# Patient Record
Sex: Female | Born: 2000 | Race: Black or African American | Hispanic: No | Marital: Single | Smoking: Current some day smoker
Health system: Southern US, Community
[De-identification: ages and names within clinical notes are randomized; demographics above are authoritative.]

## PROBLEM LIST (undated history)

## (undated) DIAGNOSIS — T7840XA Allergy, unspecified, initial encounter: Secondary | ICD-10-CM

## (undated) DIAGNOSIS — D573 Sickle-cell trait: Secondary | ICD-10-CM

## (undated) DIAGNOSIS — E669 Obesity, unspecified: Secondary | ICD-10-CM

## (undated) DIAGNOSIS — J189 Pneumonia, unspecified organism: Secondary | ICD-10-CM

## (undated) DIAGNOSIS — J9601 Acute respiratory failure with hypoxia: Secondary | ICD-10-CM

## (undated) DIAGNOSIS — D4701 Cutaneous mastocytosis: Secondary | ICD-10-CM

## (undated) DIAGNOSIS — J45909 Unspecified asthma, uncomplicated: Secondary | ICD-10-CM

## (undated) HISTORY — PX: NO PAST SURGERIES: SHX2092

## (undated) HISTORY — DX: Sickle-cell trait: D57.3

## (undated) HISTORY — DX: Cutaneous mastocytosis: D47.01

## (undated) HISTORY — DX: Acute respiratory failure with hypoxia: J96.01

---
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
## 2020-01-20 ENCOUNTER — Encounter (HOSPITAL_COMMUNITY): Payer: Self-pay

## 2020-01-20 ENCOUNTER — Other Ambulatory Visit: Payer: Self-pay

## 2020-01-20 ENCOUNTER — Emergency Department (HOSPITAL_COMMUNITY)
Admission: EM | Admit: 2020-01-20 | Discharge: 2020-01-20 | Disposition: A | Discharge status: Home / Self Care | Payer: PPO | Attending: Emergency Medicine | Admitting: Emergency Medicine

## 2020-01-20 DIAGNOSIS — R0602 Shortness of breath: Secondary | ICD-10-CM | POA: Diagnosis present

## 2020-01-20 DIAGNOSIS — R0981 Nasal congestion: Secondary | ICD-10-CM | POA: Insufficient documentation

## 2020-01-20 DIAGNOSIS — R05 Cough: Secondary | ICD-10-CM | POA: Diagnosis not present

## 2020-01-20 DIAGNOSIS — E669 Obesity, unspecified: Secondary | ICD-10-CM | POA: Diagnosis not present

## 2020-01-20 DIAGNOSIS — Z720 Tobacco use: Secondary | ICD-10-CM | POA: Diagnosis not present

## 2020-01-20 DIAGNOSIS — J4541 Moderate persistent asthma with (acute) exacerbation: Secondary | ICD-10-CM | POA: Insufficient documentation

## 2020-01-20 DIAGNOSIS — Z6835 Body mass index (BMI) 35.0-35.9, adult: Secondary | ICD-10-CM | POA: Insufficient documentation

## 2020-01-20 MED ORDER — IPRATROPIUM-ALBUTEROL 0.5-2.5 (3) MG/3ML IN SOLN
3.0000 mL | Freq: Once | RESPIRATORY_TRACT | Status: AC
  Administered 2020-01-20: 3 mL via RESPIRATORY_TRACT
  Filled 2020-01-20: qty 3

## 2020-01-20 MED ORDER — DEXAMETHASONE 6 MG PO TABS
16.0000 mg | ORAL_TABLET | Freq: Once | ORAL | Status: AC
  Administered 2020-01-20: 16 mg via ORAL
  Filled 2020-01-20: qty 1

## 2020-01-20 MED ORDER — IPRATROPIUM-ALBUTEROL 0.5-2.5 (3) MG/3ML IN SOLN
3.0000 mL | Freq: Once | RESPIRATORY_TRACT | Status: AC
  Administered 2020-01-20: 18:00:00 3 mL via RESPIRATORY_TRACT
  Filled 2020-01-20: qty 3

## 2020-01-20 MED ORDER — ALBUTEROL SULFATE HFA 108 (90 BASE) MCG/ACT IN AERS: 1.0000 | INHALATION_SPRAY | Freq: Four times a day (QID) | RESPIRATORY_TRACT | 0 refills | Status: DC | PRN

## 2020-01-20 NOTE — ED Provider Notes (Signed)
[facility] DEPART[facility] Provider Note   CSN: [id number] Arrival date & time: 01/20/20  1633     History Chief Complaint  Patient presents with  . Shortness of Breath    Phyllis Christian is a 19 y.o. female.  Patient is a 19 year old female with a history of obesity, OSA, and moderate persistent asthma who presents with shortness of breath.  Patient states she woke up today with a cold that she believes she got from her brother and this has triggered her asthma.  Patient has tried her albuterol inhaler twice with no relief.  She also reports nasal congestion and cough, no fever.  Review of systems otherwise negative.  Patient states that she does have a prescription for Flovent but she only takes it about once a week if at all because she feels like it does not help her asthma.  Patient also endorses smoking black and mild cigars daily. No other medications, no allergies. No sick contacts.   The history is provided by the patient.       Past Medical History:  Diagnosis Date  . Acute respiratory failure with hypoxia ([place])   . Allergy    Seasonal  . Asthma   . Obesity   . Pneumonia   . Sickle cell trait ([place])   . 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 Acanthosis nigricans 11/12/2016  . Vitamin D deficiency 12/27/2015  . Contact dermatitis 12/27/2015  . Painful menstrual periods 08/04/2015  . Obstructive sleep apnea 08/04/2015  . Mild intermittent asthma without complication [id number]  . Childhood obesity, BMI 95-100 percentile 02/09/2014  . Sickle cell trait ([place])   . 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 No pertinent surgical history.   OB History   No obstetric history on file.     Family History  Problem Relation Age of Onset  . Diabetes Maternal Grandfather   . Obesity Mother     Social History   Tobacco Use  . Smoking status: Passive Smoke Exposure - Never Smoker    . Smokeless tobacco: Never Used  . Tobacco comment: outside smoker  Substance Use Topics  . Alcohol use: No  . Drug use: No    Home Medications Prior to Admission medications   Medication Sig Start Date End Date Taking? Authorizing Provider  albuterol (VENTOLIN HFA) 108 (90 Base) MCG/ACT inhaler INHALE 2 PUFFS INTO THE LUNGS EVERY 4 (FOUR) HOURS AS NEEDED FOR WHEEZING OR SHORTNESS OF BREATH. Patient taking differently: Inhale 2 puffs into the lungs every 4 (four) hours as needed for wheezing or shortness of breath.  12/29/19  Yes Roselind Messier, MD  albuterol (VENTOLIN HFA) 108 (90 Base) MCG/ACT inhaler Inhale 1-2 puffs into the lungs every 6 (six) hours as needed for wheezing or shortness of breath. 01/20/20   Hagen Tidd A., DO  cetirizine (ZYRTEC ALLERGY) 10 MG tablet Take 1 tablet (10 mg total) by mouth at bedtime. Patient not taking: Reported on 01/20/2020 08/31/19   Roselind Messier, MD  FLOVENT HFA 110 MCG/ACT inhaler Inhale 2 puffs into the lungs 2 (two) times daily. Patient not taking: Reported on 01/20/2020 03/09/19   Roselind Messier, MD  fluticasone [facility]) 50 MCG/ACT nasal spray Place 1 spray into both nostrils daily. 1 spray in each nostril every day Patient not taking: Reported on 01/20/2020 08/31/19   Roselind Messier, MD  Norethindrone Acetate-Ethinyl Estradiol (LOESTRIN) 1.5-30 MG-MCG tablet Take 1 tablet by mouth  daily. Patient not taking: Reported on 01/20/2020 03/18/19   Roselind Messier, MD    Allergies    Codeine and Motrin [ibuprofen]  Review of Systems   Review of Systems  Constitutional: Negative for activity change, appetite change and fever.  HENT: Positive for congestion. Negative for sore throat and trouble swallowing.   Eyes: Negative.   Respiratory: Positive for cough, shortness of breath and wheezing.   Cardiovascular: Negative.   Gastrointestinal: Negative.   Endocrine: Negative.   Genitourinary: Negative.   Musculoskeletal: Negative.   All  other systems reviewed and are negative.   Physical Exam Updated Vital Signs BP 127/87 (BP Location: Left Arm)   Pulse 87   Temp 98.8 F (37.1 C) (Oral)   Resp 18   Ht 5\' 6"  (1.676 m)   Wt 99.8 kg   SpO2 100%   BMI 35.51 kg/m   Physical Exam Vitals and nursing note reviewed.  Constitutional:      General: She is not in acute distress.    Appearance: She is well-developed. She is obese. She is not ill-appearing or toxic-appearing.  HENT:     Head: Normocephalic and atraumatic.     Mouth/Throat:     Mouth: Mucous membranes are moist.  Eyes:     Extraocular Movements: Extraocular movements intact.  Cardiovascular:     Rate and Rhythm: Normal rate and regular rhythm.     Pulses: Normal pulses.  Pulmonary:     Effort: Respiratory distress present.     Breath sounds: Examination of the right-upper field reveals wheezing. Examination of the left-upper field reveals wheezing. Examination of the right-middle field reveals wheezing. Examination of the left-middle field reveals wheezing. Examination of the right-lower field reveals wheezing. Examination of the left-lower field reveals wheezing. Wheezing present.  Abdominal:     Palpations: Abdomen is soft.  Musculoskeletal:        General: Normal range of motion.     Cervical back: Normal range of motion and neck supple.  Skin:    General: Skin is warm and dry.     Capillary Refill: Capillary refill takes less than 2 seconds.  Neurological:     General: No focal deficit present.     Mental Status: She is alert and oriented to person, place, and time.  Psychiatric:        Mood and Affect: Mood normal.     ED Results / Procedures / Treatments   Labs (all labs ordered are listed, but only abnormal results are displayed) Labs Reviewed - No data to display  EKG None  Radiology No results found.  Procedures Procedures (including critical care time)  Medications Ordered in ED Medications  ipratropium-albuterol (DUONEB)  0.5-2.5 (3) MG/3ML nebulizer solution 3 mL (3 mLs Nebulization Given 01/20/20 1745)  ipratropium-albuterol (DUONEB) 0.5-2.5 (3) MG/3ML nebulizer solution 3 mL (3 mLs Nebulization Given 01/20/20 1744)  ipratropium-albuterol (DUONEB) 0.5-2.5 (3) MG/3ML nebulizer solution 3 mL (3 mLs Nebulization Given 01/20/20 1729)  dexamethasone (DECADRON) tablet 16 mg (16 mg Oral Given 01/20/20 1805)    ED Course  I have reviewed the triage vital signs and the nursing notes.  Pertinent labs & imaging results that were available during my care of the patient were reviewed by me and considered in my medical decision making (see chart for details).    MDM Rules/Calculators/A&P                      Patient is an 19 year old female with a  history of obesity, OSA, moderate persistent asthma with noncompliance who presents with shortness of breath in the setting of URI.  On exam she has audible wheezing, normal respiratory rate, biphasic wheezing throughout all lung fields.  She is in saturation 100% on room air.  History and exam consistent with asthma exacerbation.  Will trial duonebs x3 and decadron and reassess.  On reassessment wheezing has almost completely resolved, still some scant expiratory wheezing. Normal WOB. Patient states she feels much better. Complained of chest pain so EKG obtained and shows normal sinus rhythm, normal intervals, normal ST segments. I advised to continue albuterol every 4 hours while awake for the next 24hrs. Advised the importance of taking flovent daily to prevent exacerbations and limiting smoking. Patient stable for discharge home. Patient and family express understanding regarding plan. Return precautions discussed and all questions answered.     Final Clinical Impression(s) / ED Diagnoses Final diagnoses:  Moderate persistent asthma with exacerbation    Rx / DC Orders ED Discharge Orders         Ordered    albuterol (VENTOLIN HFA) 108 (90 Base) MCG/ACT inhaler  Every 6  hours PRN     01/20/20 1847           Aubriegh Minch A., DO 01/20/20 1852

## 2020-01-20 NOTE — ED Notes (Signed)
Pt given water to drink at this time.

## 2020-01-20 NOTE — Discharge Instructions (Addendum)
Take albuterol inhaler 6-8 puffs every 4 hours while awake for the next 24hrs Take flovent daily as prescribed!  Try to limit smoking especially during this asthma exacerbation

## 2020-01-20 NOTE — ED Triage Notes (Addendum)
Pt arrives to ED w/ c/o SOB. States she woke up w/ a cold and her asthma is worse. Resp e/u.

## 2020-02-18 ENCOUNTER — Encounter: Payer: Self-pay | Admitting: Pediatrics

## 2020-02-18 ENCOUNTER — Ambulatory Visit (INDEPENDENT_AMBULATORY_CARE_PROVIDER_SITE_OTHER): Payer: Medicaid Other | Admitting: Pediatrics

## 2020-02-18 ENCOUNTER — Other Ambulatory Visit: Payer: Self-pay

## 2020-02-18 VITALS — BP 122/62 | HR 70 | Temp 96.4°F | Ht 67.0 in | Wt 230.0 lb

## 2020-02-18 DIAGNOSIS — J4541 Moderate persistent asthma with (acute) exacerbation: Secondary | ICD-10-CM

## 2020-02-18 DIAGNOSIS — J309 Allergic rhinitis, unspecified: Secondary | ICD-10-CM | POA: Diagnosis not present

## 2020-02-18 MED ORDER — BUDESONIDE-FORMOTEROL FUMARATE 160-4.5 MCG/ACT IN AERO: 2.0000 | INHALATION_SPRAY | Freq: Two times a day (BID) | RESPIRATORY_TRACT | 12 refills | Status: DC

## 2020-02-18 MED ORDER — CETIRIZINE HCL 10 MG PO TABS: 10.0000 mg | ORAL_TABLET | Freq: Every day | ORAL | 5 refills | Status: DC

## 2020-02-18 MED ORDER — FLUTICASONE PROPIONATE 50 MCG/ACT NA SUSP: 1.0000 | Freq: Every day | NASAL | 5 refills | Status: DC

## 2020-02-18 NOTE — Progress Notes (Signed)
Subjective:     Phyllis Christian, is a 19 y.o. female  HPI  Chief Complaint  Patient presents with  . Follow-up    asthma  . Medication Refill   Recent events 01/20/2020 ED No other ED visits in the last 1 year No other visits for asthma exacerbations in the last 1 year History of very poorly controlled asthma for several years  ED visit 01/18/2020 Trigger reported by patient URI Smokes black and mild cigars daily  Treatment 3 duoneb and oral decadron EKG checked-normal for chest pain  Also seen 08/2019 AR video   Sleep: Can't breat at night  Sleeps with fan Hot makes it worse Cool air helps Feels congested at night Cant breath Takes albuterol at night 2-3 times a week at night Coughs at night Report choking  Day symptoms? No symptoms at work at [employer] if near [place] it worse recently while at home Walking: no problem Uses inhaler if moving too fast allbuterol-uses most days, when wake up  Uses Flovent-couple times a week Has a spacer-not always use Know it works better with the spacer  Allergies are part of trigger Allergic to dust, grass, pollen Hard to breathe through her nose Uses nose spray--one a month--were bad last month  Smokes-- Mom and dad both smoke in house Patient smokes twice a week now--was every day is reported to the ED scared Marijuana--three times a week  Patient aware her symptoms really better if she used Flovent more often   Social setting Fine at [employer] at [employer] now, 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  History and Problem List: Phyllis Christian has Urticaria pigmentosa; Childhood obesity, BMI 95-100 percentile; Sickle  cell trait ([place]); Painful menstrual periods; Obstructive sleep apnea; Mild intermittent asthma without complication; Vitamin D deficiency; Contact dermatitis; and Acanthosis nigricans on their problem list.  Phyllis Christian  has a past medical history of Acute respiratory failure with hypoxia ([place]), Allergy, Asthma, Obesity, Pneumonia, Sickle cell trait ([place]), and Urticaria pigmentosa (07/15/2012).     Objective:     BP 122/62 (BP Location: Right Arm, Patient Position: Sitting)   Pulse 70   Temp (!) 96.4 F (35.8 C) (Temporal)   Ht 5\' 7"  (1.702 m)   Wt 230 lb (104.3 kg)   SpO2 96%   BMI 36.02 kg/m   Physical Exam Vitals and nursing note reviewed.  Constitutional:      General: She is not in acute distress.    Appearance: Normal appearance.     Comments: Smells strongly of smoke  HENT:     Head: Normocephalic and atraumatic.     Right Ear: External ear normal.     Left Ear: External ear normal.     Nose: Congestion present.     Comments: Right  nares mostly obstructed by turbinate, left half obstructed by turbinate Eyes:     General:        Right eye: No discharge.        Left eye: No discharge.     Conjunctiva/sclera: Conjunctivae normal.  Cardiovascular:     Rate and Rhythm: Normal rate and regular rhythm.     Heart sounds: Normal heart sounds.  Pulmonary:     Effort: No respiratory  distress.     Breath sounds: Wheezing present. No rales.     Comments: Audible breathing, no retractions, wheezes in bases, Abdominal:     General: There is no distension.     Palpations: Abdomen is soft.     Tenderness: There is no abdominal tenderness.  Musculoskeletal:     Cervical back: Normal range of motion.  Skin:    General: Skin is warm and dry.     Findings: No rash.  Neurological:     Mental Status: She is alert.        Assessment & Plan:   1. Moderate persistent asthma with exacerbation Poorly controlled Please use spacer Discussed new use of Symbicort as single maintenance  and rescue treatment. We will not need to use additional Flovent Please do not smoke  - budesonide-formoterol (SYMBICORT) 160-4.5 MCG/ACT inhaler; Inhale 2 puffs into the lungs 2 (two) times daily.  Dispense: 1 Inhaler; Refill: 12  2. Allergic rhinitis, unspecified seasonality, unspecified trigger  Nasal obstruction due to allergies and or irritants such as smoke are clearly part of the symptoms  - fluticasone (FLONASE) 50 MCG/ACT nasal spray; Place 1 spray into both nostrils daily. 1 spray in each nostril every day  Dispense: 16 g; Refill: 5 - cetirizine (ZYRTEC ALLERGY) 10 MG tablet; Take 1 tablet (10 mg total) by mouth at bedtime.  Dispense: 30 tablet; Refill: 5   Did not determine to what extent obstructive sleep apnea might be contributing to nighttime symptoms--we will check on follow-up.  Discussed that she is old enough to transfer to adult primary care clinic.  Because I changed her medicines today, would like to see her in 1 to 2 months before completing transfer to adult primary care.  Supportive care and return precautions reviewed.  Spent  30  minutes reviewing charts, discussing diagnosis and treatment plan with patient, documentation and case coordination.   Phyllis Messier, MD

## 2020-04-13 ENCOUNTER — Ambulatory Visit: Payer: Medicaid Other | Admitting: Pediatrics

## 2020-05-07 ENCOUNTER — Other Ambulatory Visit: Payer: Self-pay | Admitting: Pediatrics

## 2020-05-07 DIAGNOSIS — N920 Excessive and frequent menstruation with regular cycle: Secondary | ICD-10-CM

## 2020-05-08 NOTE — Telephone Encounter (Signed)
Last seen for this problem 03/18/19, no show for appointment with Dr. Jess Barters 04/13/20. I called preferred number on file and left message asking to call [facility] to schedule appointment. Routing to [employer] pool for follow up.

## 2020-05-09 NOTE — Telephone Encounter (Signed)
Agree with plan as per Genella Mech, RN.   Refill for Junel not approved.  Last seen for Menorrhagia 03/2019

## 2020-05-17 ENCOUNTER — Other Ambulatory Visit: Payer: Self-pay

## 2020-05-17 ENCOUNTER — Ambulatory Visit (INDEPENDENT_AMBULATORY_CARE_PROVIDER_SITE_OTHER): Payer: Medicaid Other | Admitting: Pediatrics

## 2020-05-17 ENCOUNTER — Other Ambulatory Visit (HOSPITAL_COMMUNITY)
Admission: RE | Admit: 2020-05-17 | Discharge: 2020-05-17 | Disposition: A | Discharge status: Home / Self Care | Payer: Medicaid Other | Source: Ambulatory Visit | Attending: Pediatrics | Admitting: Pediatrics

## 2020-05-17 VITALS — BP 122/70 | HR 81 | Ht 66.54 in | Wt 218.4 lb

## 2020-05-17 DIAGNOSIS — Z7189 Other specified counseling: Secondary | ICD-10-CM | POA: Diagnosis not present

## 2020-05-17 DIAGNOSIS — Z029 Encounter for administrative examinations, unspecified: Secondary | ICD-10-CM

## 2020-05-17 DIAGNOSIS — J4540 Moderate persistent asthma, uncomplicated: Secondary | ICD-10-CM | POA: Diagnosis not present

## 2020-05-17 DIAGNOSIS — Z0001 Encounter for general adult medical examination with abnormal findings: Secondary | ICD-10-CM | POA: Diagnosis not present

## 2020-05-17 DIAGNOSIS — Z113 Encounter for screening for infections with a predominantly sexual mode of transmission: Secondary | ICD-10-CM | POA: Insufficient documentation

## 2020-05-17 DIAGNOSIS — Z68.41 Body mass index (BMI) pediatric, greater than or equal to 95th percentile for age: Secondary | ICD-10-CM | POA: Diagnosis not present

## 2020-05-17 DIAGNOSIS — N920 Excessive and frequent menstruation with regular cycle: Secondary | ICD-10-CM

## 2020-05-17 LAB — POCT RAPID HIV: Rapid HIV, POC: NEGATIVE

## 2020-05-17 MED ORDER — NORETHINDRONE ACET-ETHINYL EST 1.5-30 MG-MCG PO TABS: 1.0000 | ORAL_TABLET | Freq: Every day | ORAL | 11 refills | Status: DC

## 2020-05-17 MED ORDER — ALBUTEROL SULFATE HFA 108 (90 BASE) MCG/ACT IN AERS: 2.0000 | INHALATION_SPRAY | RESPIRATORY_TRACT | 0 refills | Status: DC | PRN

## 2020-05-17 NOTE — Progress Notes (Signed)
Adolescent Well Care Visit Phyllis Christian is a 19 y.o. female who is here for well care.    PCP:  Phyllis Messier, MD   History was provided by the patient.  Confidentiality was discussed with the patient and, if applicable, with caregiver as well. Patient's personal or confidential phone number: [phone]   Current Issues: Current concerns include need to refill OCPs for menstrual regulation  Asthma - using symbicort daily No cough, no chest tightness,  Exercise at work [employer] - moving constantly  Nutrition: Nutrition/eating behaviors: no change in food choices, but appetite a little less so eating smaller portions Adequate calcium in diet?: oj with calcium and vitamin D added Supplements/ vitamins: no  Exercise/ Media: Play any sports? no Exercise: at work Screen time:  > 2 hours-counseling provided Media rules or monitoring?: no  Sleep:  Sleep: still hard to sleep  Social Screening: Lives with:  Mother and ?father Parental relations:  good Activities, work, and chores?: household Concerns regarding behavior with peers?  no Stressors of note: yes - work  Education: School grade and name: previously at [employer]: doing well; no concerns School behavior: doing well; no concerns  Menstruation:   Patient's last menstrual period was 05/11/2020 (exact date). Menstrual history: lighter with OCPs, minimal cramps   Tobacco?  yes Secondhand smoke exposure?  yes Drugs/ETOH?  yes, not often  Sexually Active?  yes   Pregnancy Prevention: OCPs Same female partner for 3 years; using condoms  Screenings: Patient has a dental home: yes  The patient completed the Rapid Assessment for Adolescent Preventive Services screening questionnaire and the following topics were identified as risk factors and discussed: healthy eating, exercise, tobacco use, drug use and mental health issues and counseling provided.  Other topics of anticipatory guidance  related to reproductive health, substance use and media use were discussed.     PHQ-9 completed and results indicated score = 2 for sleep issues  Physical Exam:  Vitals:   05/17/20 1020  BP: 122/70  Pulse: 81  SpO2: 99%  Weight: 218 lb 6.4 oz (99.1 kg)  Height: 5' 6.54" (1.69 m)   BP 122/70 (BP Location: Right Arm, Patient Position: Sitting, Cuff Size: Large)   Pulse 81   Ht 5' 6.54" (1.69 m)   Wt 218 lb 6.4 oz (99.1 kg)   LMP 05/11/2020 (Exact Date)   SpO2 99%   BMI 34.69 kg/m  Body mass index: body mass index is 34.69 kg/m. Blood pressure percentiles are not available for patients who are 18 years or older.   Hearing Screening   Method: Audiometry   125Hz  250Hz  500Hz  1000Hz  2000Hz  3000Hz  4000Hz  6000Hz  8000Hz   Right ear:   20 20 20  20     Left ear:   20 20 20  20       Visual Acuity Screening   Right eye Left eye Both eyes  Without correction: 20/16 20/16 20/16   With correction:       General Appearance:   alert, oriented, no acute distress and obese, relaxed and articulate  HENT: normocephalic, no obvious abnormality, conjunctiva clear  Mouth:   oropharynx moist, palate, tongue and gums normal; teeth good condition  Neck:   supple, no adenopathy; thyroid: symmetric, no enlargement, no tenderness/mass/nodules  Chest Normal female with breasts: 5  Lungs:   clear to auscultation bilaterally, even air movement   Heart:   regular rate and rhythm, S1 and S2 normal, no murmurs   Abdomen:   soft, non-tender,  normal bowel sounds; no mass, or organomegaly  GU normal female external genitalia, pelvic not performed, shaved pubis  Musculoskeletal:   tone and strength strong and symmetrical, all extremities full range of motion           Lymphatic:   no adenopathy  Skin/Hair/Nails:   skin warm and dry; no bruises, no rashes, no lesions  Neurologic:   oriented, no focal deficits; strength, gait, and coordination normal and age-appropriate     Assessment and Plan:   Older  adolescent with some good habits  Asthma Control sounds improved with symbicort - refilled No albuterol use for several months - refilled one inhaler for rescue Advised to call if use needed Reviewed spacer and technique  Menorrhagia No side effects with OCP Reordered for regulation and also for family planning Reported using daily  BMI is not appropriate for age But improving at reasonable rate of 2# per month Encouraged daily walking outside of work and healthy food choices  Hearing screening result:normal Vision screening result: normal  Counseling provided for all of the vaccine components  Orders Placed This Encounter  Procedures  . POCT Rapid HIV   Covid vaccine counsel [name] thinks she'll wait but has been thinking about it.  GM and others have been encouraging her.   Thinking she will go with her mother.  Stressed minor nature of side effects, prevalence of misinformation, and availability in community.  Mother called and told her to wait so they can go together.  Possibly tomorrow with medication pick up.  CHL connectivity failure during visit.  No AVS printed.    Access will be by MyChart, which patient knows how to use Return in about 1 year (around 05/17/2021) for routine well check and in fall for flu vaccine.Santiago Glad, MD

## 2020-05-17 NOTE — Patient Instructions (Addendum)
Keep doing what you have been that has helped you lose weight.  You are losing at a healthy rate.    Walking daily, outside of work, is a great way to help reset your metabolism and also to relax. Please let us know by MyChart when you have gotten your covid vaccine.  It will help you stay safe and help your family and friends stay safe.   Adult Primary Care Clinics Name [facility]  Address: [place], [place] [address]  Phone: [phone] Hours: Monday - Friday 9 AM -6 PM  Types of insurance accepted:  Marland Kitchen Pharmacist, community . [place] (orange card) . Medicaid . Medicare . Uninsured  Language services:  Marland Kitchen Video and phone interpreters available   Ages 18 and older    . Adult primary care . Onsite pharmacy . Integrated behavioral health . Financial assistance counseling . Walk-in hours for established patients  Financial assistance counseling hours: Tuesdays 2:00PM - 5:00PM  Thursday 8:30AM - 4:30PM  Space is limited, 10 on Tuesday and 20 on Thursday. It's on first come first serve basis  Name [facility]  Address: [address] [place], [place] [address]  Phone: [phone]  Hours: Monday - Friday 8:30 AM - 5 PM  Types of insurance accepted:  Marland Kitchen Pharmacist, community . Medicaid . Medicare . Uninsured  Language services:  Marland Kitchen Video and phone interpreters available   All ages - newborn to adult   . Primary care for all ages (children and adults) . Integrated behavioral health . Nutritionist . Financial assistance counseling   Name [facility] on the ground floor of [facility]  Address: [address],  [place]  [address]  Phone: [phone]  Hours: Monday - Friday 8:15 AM - 5 PM  Types of insurance accepted:  Marland Kitchen Sports coach . Medicaid . Medicare . Uninsured  Language services:  Marland Kitchen Video and phone interpreters available   Ages 26 and older   . Adult primary care . Nutritionist . Certified Diabetes Educator  . Integrated behavioral health . Financial assistance counseling   Name [facility]  Address: [address] [place], [place] [address]  Phone: [phone]  Hours: Monday - Friday 8:30 AM - 5 PM    Types of insurance accepted:  Marland Kitchen Pharmacist, community . Medicaid . Medicare . Uninsured  Language services:  Marland Kitchen Video and phone interpreters available   All ages - newborn to adult   . Primary care for all ages (children and adults) . Integrated behavioral health . Financial assistance counseling

## 2020-05-18 LAB — URINE CYTOLOGY ANCILLARY ONLY
Chlamydia: NEGATIVE
Comment: NEGATIVE
Comment: NORMAL
Neisseria Gonorrhea: NEGATIVE

## 2020-09-08 ENCOUNTER — Telehealth: Payer: Self-pay

## 2020-09-08 ENCOUNTER — Other Ambulatory Visit: Payer: Self-pay | Admitting: Pediatrics

## 2020-09-08 DIAGNOSIS — J4540 Moderate persistent asthma, uncomplicated: Secondary | ICD-10-CM

## 2020-09-08 MED ORDER — ALBUTEROL SULFATE HFA 108 (90 BASE) MCG/ACT IN AERS: 2.0000 | INHALATION_SPRAY | RESPIRATORY_TRACT | 0 refills | Status: DC | PRN

## 2020-09-08 NOTE — Telephone Encounter (Signed)
Request for refill on ProAir.

## 2020-09-08 NOTE — Progress Notes (Signed)
Request for refill for proAir inhaler sent to pharmacy of record. Satira Mccallum MSN, CPNP, CDCES

## 2020-10-08 ENCOUNTER — Other Ambulatory Visit: Payer: Self-pay | Admitting: Pediatrics

## 2020-10-08 DIAGNOSIS — J4540 Moderate persistent asthma, uncomplicated: Secondary | ICD-10-CM

## 2021-01-08 ENCOUNTER — Other Ambulatory Visit: Payer: Self-pay | Admitting: Pediatrics

## 2021-01-08 DIAGNOSIS — J4540 Moderate persistent asthma, uncomplicated: Secondary | ICD-10-CM

## 2021-02-08 ENCOUNTER — Other Ambulatory Visit (HOSPITAL_COMMUNITY)
Admission: RE | Admit: 2021-02-08 | Discharge: 2021-02-08 | Disposition: A | Discharge status: Home / Self Care | Payer: Medicaid Other | Source: Ambulatory Visit | Attending: Pediatrics | Admitting: Pediatrics

## 2021-02-08 ENCOUNTER — Ambulatory Visit (INDEPENDENT_AMBULATORY_CARE_PROVIDER_SITE_OTHER): Payer: Medicaid Other | Admitting: Pediatrics

## 2021-02-08 ENCOUNTER — Other Ambulatory Visit: Payer: Self-pay

## 2021-02-08 VITALS — Wt 234.0 lb

## 2021-02-08 DIAGNOSIS — Z3201 Encounter for pregnancy test, result positive: Secondary | ICD-10-CM | POA: Diagnosis not present

## 2021-02-08 DIAGNOSIS — N910 Primary amenorrhea: Secondary | ICD-10-CM

## 2021-02-08 DIAGNOSIS — Z3490 Encounter for supervision of normal pregnancy, unspecified, unspecified trimester: Secondary | ICD-10-CM

## 2021-02-08 DIAGNOSIS — Z3480 Encounter for supervision of other normal pregnancy, unspecified trimester: Secondary | ICD-10-CM | POA: Insufficient documentation

## 2021-02-08 LAB — POCT URINE PREGNANCY: Preg Test, Ur: POSITIVE — AB

## 2021-02-08 MED ORDER — PRENATAL VITAMIN 27-0.8 MG PO TABS
1.0000 | ORAL_TABLET | Freq: Every day | ORAL | 10 refills | Status: DC
Start: 2021-02-08 — End: 2023-07-28

## 2021-02-08 NOTE — Progress Notes (Addendum)
Subjective:     Phyllis Christian, is a 20 y.o. female   History provider by patient No interpreter necessary.  Chief Complaint  Patient presents with  . requests pregnancy test    Denies sx of UTI, asking for preg test. Did not obtain covid vaccine. Needs albuterol inhaler refilled.     HPI: 20 yo female presents for pregnancy test. She has not had a period in over a month, no n/v or breast tenderness. Pregnancy test is positive. Her LMP was December 01, 2020. Her cycle is regular, usually 28-24 days (she uses a tracker app). Patient had previously been on birth control, OCPs, she did not take it every day. Patient last had sexual intercourse 01/29/21. No h/o STIs. She took a pregnancy test at home that was positive earlier today. She reports occasionally using marijuana and smoking cigarettes, but says she can easily stop doing this. She is not a daily smoker. She does not drink EtOH or use other recreational drugs. Patient has a committed partner, job, and her mother knows. She is excited about the pregnancy.  Review of Systems  Constitutional: Negative for activity change, appetite change, chills, fatigue and fever.  Respiratory: Negative for chest tightness and shortness of breath.   Cardiovascular: Negative for chest pain.  Gastrointestinal: Negative for abdominal pain, nausea and vomiting.  Genitourinary: Positive for menstrual problem. Negative for pelvic pain and vaginal bleeding.  All other systems reviewed and are negative.    Patient's history was reviewed and updated as appropriate: allergies, current medications, past family history, past medical history, past social history, past surgical history and problem list.     Objective:     Wt 234 lb (106.1 kg)   BMI 37.16 kg/m   Physical Exam Vitals and nursing note reviewed.  Constitutional:      General: She is not in acute distress.    Appearance: Normal appearance. She is obese. She is not ill-appearing,  toxic-appearing or diaphoretic.  HENT:     Head: Normocephalic and atraumatic.  Eyes:     Conjunctiva/sclera: Conjunctivae normal.  Cardiovascular:     Rate and Rhythm: Normal rate and regular rhythm.     Pulses: Normal pulses.     Heart sounds: Normal heart sounds.  Skin:    General: Skin is warm and dry.  Neurological:     General: No focal deficit present.     Mental Status: She is alert. Mental status is at baseline.  Psychiatric:        Mood and Affect: Mood normal.        Behavior: Behavior normal.        Assessment & Plan:   Positive pregnancy test: 20 year old patient experiencing amenorrhea since February has positive pregnancy test today. This is first pregnancy for her. Discussed next steps for prenatal care, will refer to obstetrics. Patient will need [name] for dating as she was taking OCPs intermittently. Went over med list. Counseled on stopping cigarettes and marijuana, which patient agrees to. Prenatal vitamins prescribed.   Supportive care and return precautions reviewed.  Return if symptoms worsen or fail to improve.  Gladys Damme, MD  I reviewed with the resident the medical history and the resident's findings on physical examination. I discussed with the resident the patient's diagnosis and concur with the treatment plan as documented in the resident's note.  Antony Odea, MD                 02/08/2021, 4:41 PM

## 2021-02-08 NOTE — Patient Instructions (Addendum)
Congratulations!  1. Go to the MAU at [place] [facility] if:  You have pain in your lower abdomen or pelvic area  You have vaginal bleeding.  2. We suggest staying healthy for yourself and your baby.  This includes eating lots of fruits and vegetables, avoiding alcohol and cigarettes, and trying to get some exercise (walking is great).  Recommend only using tylenol for pain (list of medications below), taking daily prenatal vitamin, not cleaning cat litter, avoiding people who are sick (especially children), eating only one serving of tuna per week, no raw meat or fish, heating deli meat to steaming before eating, and making sure all dairy products are pasteurized.  3. I have placed a referral to obstetrics to begin prenatal care. You should get a call to schedule this within the next week. Please call the office back if you need any help in connecting with prenatal care: [phone].  Best Wishes,  Dr. Chauncey Reading

## 2021-02-08 NOTE — Assessment & Plan Note (Signed)
20 year old patient experiencing amenorrhea since February has positive pregnancy test today. This is first pregnancy for her. Discussed next steps for prenatal care, will refer to obstetrics. Patient will need [name] for dating as she was taking OCPs intermittently. Prenatal vitamins prescribed.

## 2021-02-09 LAB — URINE CYTOLOGY ANCILLARY ONLY
Chlamydia: NEGATIVE
Comment: NEGATIVE
Comment: NORMAL
Neisseria Gonorrhea: NEGATIVE

## 2021-02-12 ENCOUNTER — Telehealth: Payer: Self-pay

## 2021-02-21 ENCOUNTER — Encounter (HOSPITAL_COMMUNITY): Payer: Self-pay | Admitting: Obstetrics & Gynecology

## 2021-02-21 ENCOUNTER — Inpatient Hospital Stay (HOSPITAL_COMMUNITY): Payer: Medicaid Other

## 2021-02-21 ENCOUNTER — Other Ambulatory Visit: Payer: Self-pay

## 2021-02-21 ENCOUNTER — Inpatient Hospital Stay (HOSPITAL_COMMUNITY)
Admission: AD | Admit: 2021-02-21 | Discharge: 2021-02-21 | Disposition: A | Discharge status: Home / Self Care | Payer: Medicaid Other | Attending: Obstetrics & Gynecology | Admitting: Obstetrics & Gynecology

## 2021-02-21 DIAGNOSIS — O209 Hemorrhage in early pregnancy, unspecified: Secondary | ICD-10-CM | POA: Diagnosis not present

## 2021-02-21 DIAGNOSIS — Z3A08 8 weeks gestation of pregnancy: Secondary | ICD-10-CM | POA: Diagnosis not present

## 2021-02-21 DIAGNOSIS — O26891 Other specified pregnancy related conditions, first trimester: Secondary | ICD-10-CM | POA: Diagnosis not present

## 2021-02-21 DIAGNOSIS — O26851 Spotting complicating pregnancy, first trimester: Secondary | ICD-10-CM

## 2021-02-21 DIAGNOSIS — O4690 Antepartum hemorrhage, unspecified, unspecified trimester: Secondary | ICD-10-CM | POA: Diagnosis present

## 2021-02-21 DIAGNOSIS — Z3490 Encounter for supervision of normal pregnancy, unspecified, unspecified trimester: Secondary | ICD-10-CM

## 2021-02-21 LAB — WET PREP, GENITAL
Sperm: NONE SEEN
Trich, Wet Prep: NONE SEEN
Yeast Wet Prep HPF POC: NONE SEEN

## 2021-02-21 LAB — CBC
HCT: 35.1 % — ABNORMAL LOW (ref 36.0–46.0)
Hemoglobin: 11.5 g/dL — ABNORMAL LOW (ref 12.0–15.0)
MCH: 25.3 pg — ABNORMAL LOW (ref 26.0–34.0)
MCHC: 32.8 g/dL (ref 30.0–36.0)
MCV: 77.3 fL — ABNORMAL LOW (ref 80.0–100.0)
Platelets: 296 10*3/uL (ref 150–400)
RBC: 4.54 MIL/uL (ref 3.87–5.11)
RDW: 16.5 % — ABNORMAL HIGH (ref 11.5–15.5)
WBC: 12 10*3/uL — ABNORMAL HIGH (ref 4.0–10.5)
nRBC: 0 % (ref 0.0–0.2)

## 2021-02-21 LAB — URINALYSIS, ROUTINE W REFLEX MICROSCOPIC
Bilirubin Urine: NEGATIVE
Glucose, UA: NEGATIVE mg/dL
Ketones, ur: NEGATIVE mg/dL
Leukocytes,Ua: NEGATIVE
Nitrite: NEGATIVE
Protein, ur: NEGATIVE mg/dL
Specific Gravity, Urine: 1.015 (ref 1.005–1.030)
pH: 6.5 (ref 5.0–8.0)

## 2021-02-21 LAB — URINALYSIS, MICROSCOPIC (REFLEX)

## 2021-02-21 LAB — HCG, QUANTITATIVE, PREGNANCY: hCG, Beta Chain, Quant, S: 86752 m[IU]/mL — ABNORMAL HIGH (ref <5)

## 2021-02-21 NOTE — ED Provider Notes (Signed)
Emergency Medicine Provider OB Triage Evaluation Note  Phyllis Christian is a 20 y.o. female, No obstetric history on file., at Unknown gestation who presents to the emergency department with complaints of light pink vaginal bleeding onset this morning, no cramping. LMP March 2022, G1P0, confirmed test in system from 2 weeks ago. No recent intercourse.  Review of  Systems  Positive: vaginal bleeding Negative: abdominal pain  Physical Exam  BP 123/72 (BP Location: Right Arm)   Pulse 85   Temp 98.8 F (37.1 C) (Oral)   Resp 16   SpO2 100%  General: Awake, no distress  HEENT: Atraumatic  Resp: Normal effort  Cardiac: Normal rate Abd: Nondistended, nontender  MSK: Moves all extremities without difficulty Neuro: Speech clear  Medical Decision Making  Pt evaluated for pregnancy concern and is stable for transfer to MAU. Pt is in agreement with plan for transfer.  9:47 AM Discussed with MAU APP, Junie Panning, who accepts patient in transfer.  Clinical Impression  Vaginal bleeding in pregnancy.     Tacy Learn, PA-C 02/21/21 8270    Quintella Reichert, MD 02/22/21 [phone]

## 2021-02-21 NOTE — MAU Note (Signed)
Was at work, went to the bathroom and saw a little light red blood when she wiped.  Denies pain. No recent intercourse

## 2021-02-21 NOTE — MAU Provider Note (Signed)
History     CSN: [id number]  Arrival date and time: 02/21/21 6644   Event Date/Time   First Provider Initiated Contact with Patient 02/21/21 1150      Chief Complaint  Patient presents with  . Decreased Fetal Movement  . Vaginal Bleeding   HPI Phyllis Christian is a 20 y.o. G1P0 in first trimester who presents to MAU from [facility] for evaluation of pink-tinged vaginal spotting. This is a new problem, onset this morning. Patient endorses seeing smears of fluid when she wiped after voiding. She denies abdominal pain, heavy vaginal bleeding, dysuria, fever or recent illness. She is remote from sexual intercourse.  Patient is scheduled for a New Ob on Monday 02/26/2021 at [facility].  OB History    Gravida  1   Para      Term      Preterm      AB      Living        SAB      IAB      Ectopic      Multiple      Live Births              Past Medical History:  Diagnosis Date  . Acute respiratory failure with hypoxia ([place])   . Allergy    Seasonal  . Asthma   . Obesity   . Pneumonia   . Sickle cell trait ([place])   . 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 NO PAST SURGERIES      Family History  Problem Relation Age of Onset  . Diabetes Maternal Grandfather   . Obesity Mother   . Diabetes Mother   . Asthma Father     Social History   Tobacco Use  . Smoking status: Passive Smoke Exposure - Never Smoker  . Smokeless tobacco: Never Used  . Tobacco comment: outside smoker  Vaping Use  . Vaping Use: Never used  Substance Use Topics  . Alcohol use: No  . Drug use: No    Allergies:  Allergies  Allergen Reactions  . Codeine Hives  . Motrin [Ibuprofen] Hives    Medications Prior to Admission  Medication Sig Dispense Refill Last Dose  . budesonide-formoterol (SYMBICORT) 160-4.5 MCG/ACT inhaler Inhale 2 puffs into the lungs 2 (two) times daily. 1 Inhaler 12 02/20/2021 at Unknown time  . acetaminophen  (TYLENOL) 500 MG tablet Take 1,000 mg by mouth every 6 (six) hours as needed.   More than a month at Unknown time  . albuterol (VENTOLIN HFA) 108 (90 Base) MCG/ACT inhaler Inhale 2 puffs into the lungs every 4 (four) hours as needed for wheezing (or cough). Shake well; always use spacer. (Patient not taking: Reported on 02/08/2021) 18 g 0   . fluticasone (FLONASE) 50 MCG/ACT nasal spray Place 1 spray into both nostrils daily. 1 spray in each nostril every day (Patient not taking: No sig reported) 16 g 5 More than a month at Unknown time  . Prenatal Vit-Fe Fumarate-FA (PRENATAL VITAMIN) 27-0.8 MG TABS Take 1 tablet by mouth daily. 30 tablet 10 02/19/2021    Review of Systems  Gastrointestinal: Negative for abdominal pain.  Genitourinary: Positive for vaginal bleeding.  All other systems reviewed and are negative.  Physical Exam   Blood pressure 124/65, pulse 78, temperature 98.8 F (37.1 C), temperature source Oral, resp. rate 17, height 5\' 5"  (1.651 m), weight 105.6 kg, last menstrual period 12/03/2020, SpO2 100 %.  Physical Exam Vitals and nursing note reviewed. Exam conducted with a chaperone present.  Constitutional:      Appearance: Normal appearance. She is not ill-appearing.  Cardiovascular:     Rate and Rhythm: Normal rate.     Pulses: Normal pulses.     Heart sounds: Normal heart sounds.  Pulmonary:     Effort: Pulmonary effort is normal.     Breath sounds: Normal breath sounds.  Abdominal:     General: Abdomen is flat. Bowel sounds are normal.     Tenderness: There is no abdominal tenderness. There is no right CVA tenderness or left CVA tenderness.  Genitourinary:    Comments: Pelvic deferred due to scant spotting and reassuring ultrasound results Skin:    Capillary Refill: Capillary refill takes less than 2 seconds.  Neurological:     Mental Status: She is alert and oriented to person, place, and time.  Psychiatric:        Mood and Affect: Mood normal.        Behavior:  Behavior normal.        Thought Content: Thought content normal.        Judgment: Judgment normal.    MAU Course  Procedures: speculum exam, TVUS  Patient Vitals for the past 24 hrs:  BP Temp Temp src Pulse Resp SpO2 Height Weight  02/21/21 1258 124/65 -- -- 78 -- -- -- --  02/21/21 1117 123/71 -- -- 74 17 100 % 5\' 5"  (1.651 m) 105.6 kg  02/21/21 0942 123/72 98.8 F (37.1 C) Oral 85 16 100 % -- --   Results for orders placed or performed during the hospital encounter of 02/21/21 (from the past 24 hour(s))  Urinalysis, Routine w reflex microscopic Urine, Clean Catch     Status: Abnormal   Collection Time: 02/21/21 11:28 AM  Result Value Ref Range   Color, Urine YELLOW YELLOW   APPearance CLEAR CLEAR   Specific Gravity, Urine 1.015 1.005 - 1.030   pH 6.5 5.0 - 8.0   Glucose, UA NEGATIVE NEGATIVE mg/dL   Hgb urine dipstick SMALL (A) NEGATIVE   Bilirubin Urine NEGATIVE NEGATIVE   Ketones, ur NEGATIVE NEGATIVE mg/dL   Protein, ur NEGATIVE NEGATIVE mg/dL   Nitrite NEGATIVE NEGATIVE   Leukocytes,Ua NEGATIVE NEGATIVE  Wet prep, genital     Status: Abnormal   Collection Time: 02/21/21 11:28 AM   Specimen: Vaginal  Result Value Ref Range   Yeast Wet Prep HPF POC NONE SEEN NONE SEEN   Trich, Wet Prep NONE SEEN NONE SEEN   Clue Cells Wet Prep HPF POC PRESENT (A) NONE SEEN   WBC, Wet Prep HPF POC MODERATE (A) NONE SEEN   Sperm NONE SEEN   Urinalysis, Microscopic (reflex)     Status: Abnormal   Collection Time: 02/21/21 11:28 AM  Result Value Ref Range   RBC / HPF 0-5 0 - 5 RBC/hpf   WBC, UA 0-5 0 - 5 WBC/hpf   Bacteria, UA RARE (A) NONE SEEN   Squamous Epithelial / LPF 0-5 0 - 5  CBC     Status: Abnormal   Collection Time: 02/21/21 11:41 AM  Result Value Ref Range   WBC 12.0 (H) 4.0 - 10.5 K/uL   RBC 4.54 3.87 - 5.11 MIL/uL   Hemoglobin 11.5 (L) 12.0 - 15.0 g/dL   HCT 35.1 (L) 36.0 - 46.0 %   MCV 77.3 (L) 80.0 - 100.0 fL   MCH 25.3 (L) 26.0 - 34.0 pg   MCHC  32.8 30.0 - 36.0  g/dL   RDW 16.5 (H) 11.5 - 15.5 %   Platelets 296 150 - 400 K/uL   nRBC 0.0 0.0 - 0.2 %  hCG, quantitative, pregnancy     Status: Abnormal   Collection Time: 02/21/21 11:41 AM  Result Value Ref Range   hCG, Beta Chain, Quant, S 86,752 (H) <5 mIU/mL  ABO/Rh     Status: None   Collection Time: 02/21/21 11:41 AM  Result Value Ref Range   ABO/RH(D) A POS    No rh immune globuloin NOT A RH IMMUNE GLOBULIN CANDIDATE, PT RH POSITIVE    PT AG Type      NEGATIVE FOR A1 ANTIGEN Performed at [facility], [place] [address], [place], [place] [address]    Assessment and Plan  --20 y.o. G1P0 with confirmed live IUP at [redacted]w[redacted]d  --Hgb 11.5 --Clue cells not consistent with physical exam, will not treat for BV --Blood type A POS --Discharge home in stable condition  F/U: --New OB at [facility] 05/23. Will forward note per policy  Darlina Rumpf, CNM 02/21/2021, 5:10 PM

## 2021-02-21 NOTE — Discharge Instructions (Signed)
Safe Medications in Pregnancy   Acne: Benzoyl Peroxide Salicylic Acid  Backache/Headache: Tylenol: 2 regular strength every 4 hours OR              2 Extra strength every 6 hours  Colds/Coughs/Allergies: Benadryl (alcohol free) 25 mg every 6 hours as needed Breath right strips Claritin Cepacol throat lozenges Chloraseptic throat spray Cold-Eeze- up to three times per day Cough drops, alcohol free Flonase (by prescription only) Guaifenesin Mucinex Robitussin DM (plain only, alcohol free) Saline nasal spray/drops Sudafed (pseudoephedrine) & Actifed ** use only after [redacted] weeks gestation and if you do not have high blood pressure Tylenol Vicks Vaporub Zinc lozenges Zyrtec   Constipation: Colace Ducolax suppositories Fleet enema Glycerin suppositories Metamucil Milk of magnesia Miralax Senokot Smooth move tea  Diarrhea: Kaopectate Imodium A-D  *NO pepto Bismol  Hemorrhoids: Anusol Anusol HC Preparation H Tucks  Indigestion: Tums Maalox Mylanta Zantac  Pepcid  Insomnia: Benadryl (alcohol free) 25mg  every 6 hours as needed Tylenol PM Unisom, no Gelcaps  Leg Cramps: Tums MagGel  Nausea/Vomiting:  Bonine Dramamine Emetrol Ginger extract Sea bands Meclizine  Nausea medication to take during pregnancy:  Unisom (doxylamine succinate 25 mg tablets) Take one tablet daily at bedtime. If symptoms are not adequately controlled, the dose can be increased to a maximum recommended dose of two tablets daily (1/2 tablet in the morning, 1/2 tablet mid-afternoon and one at bedtime). Vitamin B6 100mg  tablets. Take one tablet twice a day (up to 200 mg per day).  Skin Rashes: Aveeno products Benadryl cream or 25mg  every 6 hours as needed Calamine Lotion 1% cortisone cream  Yeast infection: Gyne-lotrimin 7 Monistat 7   **If taking multiple medications, please check labels to avoid duplicating the same active ingredients **take medication as directed on  the label ** Do not exceed 4000 mg of tylenol in 24 hours **Do not take medications that contain aspirin or ibuprofen     AboveDisc[employer].com.cy.html">  First Trimester of Pregnancy  The first trimester of pregnancy starts on the first day of your last menstrual period until the end of week 12. This is also called months 1 through 3 of pregnancy. Body changes during your first trimester Your body goes through many changes during pregnancy. The changes usually return to normal after your baby is born. Physical changes  You may gain or lose weight.  Your breasts may grow larger and hurt. The area around your nipples may get darker.  Dark spots or blotches may develop on your face.  You may have changes in your hair. Health changes  You may feel like you might vomit (nauseous), and you may vomit.  You may have heartburn.  You may have headaches.  You may have trouble pooping (constipation).  Your gums may bleed. Other changes  You may get tired easily.  You may pee (urinate) more often.  Your menstrual periods will stop.  You may not feel hungry.  You may want to eat certain kinds of food.  You may have changes in your emotions from day to day.  You may have more dreams. Follow these instructions at home: Medicines  Take over-the-counter and prescription medicines only as told by your doctor. Some medicines are not safe during pregnancy.  Take a prenatal vitamin that contains at least 600 micrograms (mcg) of folic acid. Eating and drinking  Eat healthy meals that include: ? Fresh fruits and vegetables. ? Whole grains. ? Good sources of protein, such as meat, eggs, or tofu. ? Low-fat  dairy products.  Avoid raw meat and unpasteurized juice, milk, and cheese.  If you feel like you may vomit, or you vomit: ? Eat 4 or 5 small meals a day instead of 3 large meals. ? Try eating a few soda crackers. ? Drink liquids between meals instead of  during meals.  You may need to take these actions to prevent or treat trouble pooping: ? Drink enough fluids to keep your pee (urine) pale yellow. ? Eat foods that are high in fiber. These include beans, whole grains, and fresh fruits and vegetables. ? Limit foods that are high in fat and sugar. These include fried or sweet foods. Activity  Exercise only as told by your doctor. Most people can do their usual exercise routine during pregnancy.  Stop exercising if you have cramps or pain in your lower belly (abdomen) or low back.  Do not exercise if it is too hot or too humid, or if you are in a place of great height (high altitude).  Avoid heavy lifting.  If you choose to, you may have sex unless your doctor tells you not to. Relieving pain and discomfort  Wear a good support bra if your breasts are sore.  Rest with your legs raised (elevated) if you have leg cramps or low back pain.  If you have bulging veins (varicose veins) in your legs: ? Wear support hose as told by your doctor. ? Raise your feet for 15 minutes, 3-4 times a day. ? Limit salt in your food. Safety  Wear your seat belt at all times when you are in a car.  Talk with your doctor if someone is hurting you or yelling at you.  Talk with your doctor if you are feeling sad or have thoughts of hurting yourself. Lifestyle  Do not use hot tubs, steam rooms, or saunas.  Do not douche. Do not use tampons or scented sanitary pads.  Do not use herbal medicines, illegal drugs, or medicines that are not approved by your doctor. Do not drink alcohol.  Do not smoke or use any products that contain nicotine or tobacco. If you need help quitting, ask your doctor.  Avoid cat litter boxes and soil that is used by cats. These carry germs that can cause harm to the baby and can cause a loss of your baby by miscarriage or stillbirth. General instructions  Keep all follow-up visits. This is important.  Ask for help if you  need counseling or if you need help with nutrition. Your doctor can give you advice or tell you where to go for help.  Visit your dentist. At home, brush your teeth with a soft toothbrush. Floss gently.  Write down your questions. Take them to your prenatal visits. Where to find more information  American Pregnancy Association: americanpregnancy.org  [employer]: www.acog.org  Office on Women's Health: KeywordPortfolios.com.br Contact a doctor if:  You are dizzy.  You have a fever.  You have mild cramps or pressure in your lower belly.  You have a nagging pain in your belly area.  You continue to feel like you may vomit, you vomit, or you have watery poop (diarrhea) for 24 hours or longer.  You have a bad-smelling fluid coming from your vagina.  You have pain when you pee.  You are exposed to a disease that spreads from person to person, such as chickenpox, measles, Zika virus, HIV, or hepatitis. Get help right away if:  You have spotting or  bleeding from your vagina.  You have very bad belly cramping or pain.  You have shortness of breath or chest pain.  You have any kind of injury, such as from a fall or a car crash.  You have new or increased pain, swelling, or redness in an arm or leg. Summary  The first trimester of pregnancy starts on the first day of your last menstrual period until the end of week 12 (months 1 through 3).  Eat 4 or 5 small meals a day instead of 3 large meals.  Do not smoke or use any products that contain nicotine or tobacco. If you need help quitting, ask your doctor.  Keep all follow-up visits. This information is not intended to replace advice given to you by your health care provider. Make sure you discuss any questions you have with your health care provider. Document Revised: 03/01/2020 Document Reviewed: 01/06/2020 Elsevier Patient Education  2021 [employer].

## 2021-02-22 LAB — ABO/RH
ABO/RH(D): A POS
PT AG Type: NEGATIVE

## 2021-02-22 LAB — GC/CHLAMYDIA PROBE AMP (~~LOC~~) NOT AT ARMC
Chlamydia: NEGATIVE
Comment: NEGATIVE
Comment: NORMAL
Neisseria Gonorrhea: NEGATIVE

## 2021-02-27 ENCOUNTER — Ambulatory Visit (INDEPENDENT_AMBULATORY_CARE_PROVIDER_SITE_OTHER): Payer: Medicaid Other

## 2021-02-27 DIAGNOSIS — Z3A00 Weeks of gestation of pregnancy not specified: Secondary | ICD-10-CM

## 2021-02-27 DIAGNOSIS — Z3480 Encounter for supervision of other normal pregnancy, unspecified trimester: Secondary | ICD-10-CM

## 2021-02-27 MED ORDER — BLOOD PRESSURE KIT DEVI: 1.0000 | 0 refills | Status: DC | PRN

## 2021-02-27 NOTE — Progress Notes (Signed)
I connected with Phyllis Christian on 02/27/21 by telephone and verified that I am speaking with the correct person using two identifiers. 30 minutes was spent on the phone with patient.   Patient: Home Provider: [facility]  PRENATAL INTAKE SUMMARY  Phyllis Christian presents today New OB Nurse Interview.  OB History    Gravida  1   Para      Term      Preterm      AB      Living        SAB      IAB      Ectopic      Multiple      Live Births             I have reviewed the patient's medical, obstetrical, social, and family histories, medications, and available lab results.  SUBJECTIVE She has no unusual complaints  OBJECTIVE Initial Physical Exam (New OB)  GENERAL APPEARANCE:  Sounds alert and well.   ASSESSMENT Normal pregnancy  PLAN Prenatal care OB Pnl/HIV  OB Urine Culture GC/CT HgbEval SMA CF A1C Glucose   If CHTN - P/C Ratio and CMP  Blood pressure monitor sent to [place]

## 2021-03-20 ENCOUNTER — Other Ambulatory Visit: Payer: Self-pay

## 2021-03-20 ENCOUNTER — Other Ambulatory Visit (HOSPITAL_COMMUNITY)
Admission: RE | Admit: 2021-03-20 | Discharge: 2021-03-20 | Disposition: A | Discharge status: Home / Self Care | Payer: Medicaid Other | Source: Ambulatory Visit | Attending: Advanced Practice Midwife | Admitting: Advanced Practice Midwife

## 2021-03-20 ENCOUNTER — Ambulatory Visit (INDEPENDENT_AMBULATORY_CARE_PROVIDER_SITE_OTHER): Payer: Medicaid Other | Admitting: Advanced Practice Midwife

## 2021-03-20 ENCOUNTER — Other Ambulatory Visit: Payer: Self-pay | Admitting: Advanced Practice Midwife

## 2021-03-20 ENCOUNTER — Encounter: Payer: Self-pay | Admitting: Advanced Practice Midwife

## 2021-03-20 VITALS — BP 117/74 | HR 71 | Wt 227.8 lb

## 2021-03-20 DIAGNOSIS — O99212 Obesity complicating pregnancy, second trimester: Secondary | ICD-10-CM | POA: Diagnosis not present

## 2021-03-20 DIAGNOSIS — Z3A19 19 weeks gestation of pregnancy: Secondary | ICD-10-CM | POA: Diagnosis not present

## 2021-03-20 DIAGNOSIS — Z3401 Encounter for supervision of normal first pregnancy, first trimester: Secondary | ICD-10-CM | POA: Insufficient documentation

## 2021-03-20 DIAGNOSIS — Z3480 Encounter for supervision of other normal pregnancy, unspecified trimester: Secondary | ICD-10-CM

## 2021-03-20 DIAGNOSIS — O99210 Obesity complicating pregnancy, unspecified trimester: Secondary | ICD-10-CM

## 2021-03-20 NOTE — Progress Notes (Signed)
Subjective:   Irving Lubbers is a 20 y.o. G1P0 at 56w3dby early ultrasound being seen today for her first obstetrical visit.  Her obstetrical history is significant for obesity and has Urticaria pigmentosa; Childhood obesity, BMI 95-100 percentile; Sickle cell trait ([place]; Painful menstrual periods; Obstructive sleep apnea; Mild intermittent asthma without complication; Vitamin D deficiency; Contact dermatitis; Acanthosis nigricans; and Supervision of other normal pregnancy, antepartum on their problem list.. Patient does intend to breast feed. Pregnancy history fully reviewed.  Patient reports no complaints.  HISTORY: OB History  Gravida Para Term Preterm AB Living  1 0 0 0 0 0  SAB IAB Ectopic Multiple Live Births  0 0 0 0 0    # Outcome Date GA Lbr Len/2nd Weight Sex Delivery Anes PTL Lv  1 Current            Past Medical History:  Diagnosis Date   Acute respiratory failure with hypoxia (HCC)    Allergy    Seasonal   Asthma    Obesity    Pneumonia    [place]    Urticaria pigmentosa 07/15/2012   Past Surgical History:  Procedure Laterality Date   NO PAST SURGERIES     Family History  Problem Relation Age of Onset   Diabetes Maternal Grandfather    Obesity Mother    Diabetes Mother    Asthma Father    Social History   Tobacco Use   Smoking status: Passive Smoke Exposure - Never Smoker   Smokeless tobacco: Never   Tobacco comments:    outside smoker  Vaping Use   Vaping Use: Never used  Substance Use Topics   Alcohol use: No   Drug use: No   Allergies  Allergen Reactions   Codeine Hives   Motrin [Ibuprofen] Hives   Current Outpatient Medications on File Prior to Visit  Medication Sig Dispense Refill   Prenatal Vit-Fe Fumarate-FA (PRENATAL VITAMIN) 27-0.8 MG TABS Take 1 tablet by mouth daily. 30 tablet 10   acetaminophen (TYLENOL) 500 MG tablet Take 1,000 mg by mouth every 6 (six) hours as needed. (Patient not taking: Reported on  03/20/2021)     albuterol (VENTOLIN HFA) 108 (90 Base) MCG/ACT inhaler Inhale 2 puffs into the lungs every 4 (four) hours as needed for wheezing (or cough). Shake well; always use spacer. 18 g 0   Blood Pressure Monitoring (BLOOD PRESSURE KIT) DEVI 1 Device by Does not apply route as needed. (Patient not taking: Reported on 03/20/2021) 1 each 0   budesonide-formoterol (SYMBICORT) 160-4.5 MCG/ACT inhaler Inhale 2 puffs into the lungs 2 (two) times daily. (Patient not taking: Reported on 03/20/2021) 1 Inhaler 12   No current facility-administered medications on file prior to visit.     Indications for ASA therapy (per uptodate) One of the following: Previous pregnancy with preeclampsia, especially early onset and with an adverse outcome No Multifetal gestation No Chronic hypertension No Type 1 or 2 diabetes mellitus No Chronic kidney disease No Autoimmune disease (antiphospholipid syndrome, systemic lupus erythematosus)    Two or more of the following: Nulliparity Yes Obesity (body mass index >30 kg/m2) Yes Family history of preeclampsia in mother or sister No Age ?35 years No Sociodemographic characteristics (African American race, low socioeconomic level) Yes Personal risk factors (eg, previous pregnancy with low birth weight or small for gestational age infant, previous adverse pregnancy outcome [eg, stillbirth], interval >10 years between pregnancies) No   Indications for early 1 hour GTT (  per uptodate)  BMI >25 (>23 in Asian women) AND one of the following  Gestational diabetes mellitus in a previous pregnancy No Glycated hemoglobin ?5.7 percent (39 mmol/mol), impaired glucose tolerance, or impaired fasting glucose on previous testing No First-degree relative with diabetes No High-risk race/ethnicity (eg, African American, Latino, Native American, [place] American, Pacific Islander) Yes History of cardiovascular disease No Hypertension or on therapy for hypertension No High-density  lipoprotein cholesterol level <35 mg/dL (0.90 mmol/L) and/or a triglyceride level >250 mg/dL (2.82 mmol/L) No Polycystic ovary syndrome No Physical inactivity No Other clinical condition associated with insulin resistance (eg, severe obesity, acanthosis nigricans) No Previous birth of an infant weighing ?4000 g No Previous stillbirth of unknown cause No Exam   Vitals:   03/20/21 1027  BP: 117/74  Pulse: 71  Weight: 227 lb 12.8 oz (103.3 kg)      Uterus:     Pelvic Exam: Perineum: no hemorrhoids, normal perineum   Vulva: normal external genitalia, no lesions   Vagina:  normal mucosa, normal discharge   Cervix: no lesions and normal, pap smear done.    Adnexa: normal adnexa and no mass, fullness, tenderness   Bony Pelvis: average  System: General: well-developed, well-nourished female in no acute distress   Breast:  normal appearance, no masses or tenderness   Skin: normal coloration and turgor, no rashes   Neurologic: oriented, normal, negative, normal mood   Extremities: normal strength, tone, and muscle mass, ROM of all joints is normal   HEENT PERRLA, extraocular movement intact and sclera clear, anicteric   Mouth/Teeth mucous membranes moist, pharynx normal without lesions and dental hygiene good   Neck supple and no masses   Cardiovascular: regular rate and rhythm   Respiratory:  no respiratory distress, normal breath sounds   Abdomen: soft, non-tender; bowel sounds normal; no masses,  no organomegaly     Assessment:   Pregnancy: G1P0 Patient Active Problem List   Diagnosis Date Noted   Supervision of other normal pregnancy, antepartum 02/08/2021   Acanthosis nigricans 11/12/2016   Vitamin D deficiency 12/27/2015   Contact dermatitis 12/27/2015   Painful menstrual periods 08/04/2015   Obstructive sleep apnea 08/04/2015   Mild intermittent asthma without complication 13/24/4010   Childhood obesity, BMI 95-100 percentile 02/09/2014   Sickle cell trait ([place])     Urticaria pigmentosa 07/15/2012     Plan:  There are no diagnoses linked to this encounter.   Initial labs drawn. Continue prenatal vitamins. Discussed and offered genetic screening options, including Quad screen/AFP, NIPS testing, and option to decline testing. Benefits/risks/alternatives reviewed. Pt aware that anatomy US is form of genetic screening with lower accuracy in detecting trisomies than blood work.  Pt chooses genetic screening today. First trimester screen, Quad screen, and NIPS: ordered. Ultrasound discussed; fetal anatomic survey: ordered. Problem list reviewed and updated. The nature of [facility] with multiple MDs and other Advanced Practice Providers was explained to patient; also emphasized that residents, students are part of our team. Routine obstetric precautions reviewed. No follow-ups on file.   Fatima Blank, CNM 03/20/21 1:24 PM

## 2021-03-21 LAB — OBSTETRIC PANEL, INCLUDING HIV
Antibody Screen: NEGATIVE
Basophils Absolute: 0 10*3/uL (ref 0.0–0.2)
Basos: 0 %
EOS (ABSOLUTE): 0.5 10*3/uL — ABNORMAL HIGH (ref 0.0–0.4)
Eos: 5 %
HIV Screen 4th Generation wRfx: NONREACTIVE
Hematocrit: 34.7 % (ref 34.0–46.6)
Hemoglobin: 11.3 g/dL (ref 11.1–15.9)
Hepatitis B Surface Ag: NEGATIVE
Immature Grans (Abs): 0 10*3/uL (ref 0.0–0.1)
Immature Granulocytes: 0 %
Lymphocytes Absolute: 1.9 10*3/uL (ref 0.7–3.1)
Lymphs: 19 %
MCH: 25.5 pg — ABNORMAL LOW (ref 26.6–33.0)
MCHC: 32.6 g/dL (ref 31.5–35.7)
MCV: 78 fL — ABNORMAL LOW (ref 79–97)
Monocytes Absolute: 0.6 10*3/uL (ref 0.1–0.9)
Monocytes: 6 %
Neutrophils Absolute: 7.4 10*3/uL — ABNORMAL HIGH (ref 1.4–7.0)
Neutrophils: 70 %
Platelets: 259 10*3/uL (ref 150–450)
RBC: 4.44 x10E6/uL (ref 3.77–5.28)
RDW: 17.3 % — ABNORMAL HIGH (ref 11.7–15.4)
RPR Ser Ql: NONREACTIVE
Rh Factor: POSITIVE
Rubella Antibodies, IGG: 3.27 index (ref >0.99)
WBC: 10.5 10*3/uL (ref 3.4–10.8)

## 2021-03-21 LAB — CERVICOVAGINAL ANCILLARY ONLY
Chlamydia: NEGATIVE
Comment: NEGATIVE
Comment: NEGATIVE
Comment: NORMAL
Neisseria Gonorrhea: NEGATIVE
Trichomonas: NEGATIVE

## 2021-03-21 LAB — HEMOGLOBIN A1C
Est. average glucose Bld gHb Est-mCnc: 100 mg/dL
Hgb A1c MFr Bld: 5.1 % (ref 4.8–5.6)

## 2021-03-23 LAB — CULTURE, OB URINE

## 2021-03-23 LAB — URINE CULTURE, OB REFLEX

## 2021-03-27 ENCOUNTER — Encounter: Payer: Self-pay | Admitting: Advanced Practice Midwife

## 2021-04-03 ENCOUNTER — Encounter: Payer: Self-pay | Admitting: Advanced Practice Midwife

## 2021-04-13 DIAGNOSIS — Z3480 Encounter for supervision of other normal pregnancy, unspecified trimester: Secondary | ICD-10-CM | POA: Diagnosis not present

## 2021-04-17 ENCOUNTER — Ambulatory Visit (INDEPENDENT_AMBULATORY_CARE_PROVIDER_SITE_OTHER): Payer: Medicaid Other | Admitting: Obstetrics and Gynecology

## 2021-04-17 ENCOUNTER — Other Ambulatory Visit: Payer: Self-pay

## 2021-04-17 VITALS — BP 115/71 | HR 61 | Wt 230.0 lb

## 2021-04-17 DIAGNOSIS — O99210 Obesity complicating pregnancy, unspecified trimester: Secondary | ICD-10-CM | POA: Insufficient documentation

## 2021-04-17 DIAGNOSIS — Z3480 Encounter for supervision of other normal pregnancy, unspecified trimester: Secondary | ICD-10-CM

## 2021-04-17 DIAGNOSIS — Z3482 Encounter for supervision of other normal pregnancy, second trimester: Secondary | ICD-10-CM | POA: Diagnosis not present

## 2021-04-17 MED ORDER — ASPIRIN EC 81 MG PO TBEC: 81.0000 mg | DELAYED_RELEASE_TABLET | Freq: Every day | ORAL | 2 refills | Status: DC

## 2021-04-17 NOTE — Progress Notes (Signed)
   PRENATAL VISIT NOTE  Subjective:  Phyllis Christian is a 20 y.o. G1P0 at [redacted]w[redacted]d being seen today for ongoing prenatal care.  She is currently monitored for the following issues for this high-risk pregnancy and has Urticaria pigmentosa; Childhood obesity, BMI 95-100 percentile; Sickle cell trait ([place]); Painful menstrual periods; Obstructive sleep apnea; Mild intermittent asthma without complication; Vitamin D deficiency; Contact dermatitis; Acanthosis nigricans; Supervision of other normal pregnancy, antepartum; and Maternal obesity affecting pregnancy, antepartum on their problem list.  Patient reports no complaints.  Contractions: Not present. Vag. Bleeding: None.  Movement: Absent. Denies leaking of fluid.   The following portions of the patient's history were reviewed and updated as appropriate: allergies, current medications, past family history, past medical history, past social history, past surgical history and problem list.   Objective:   Vitals:   04/17/21 1109  BP: 115/71  Pulse: 61  Weight: 230 lb (104.3 kg)    Fetal Status: Fetal Heart Rate (bpm): 155   Movement: Absent     General:  Alert, oriented and cooperative. Patient is in no acute distress.  Skin: Skin is warm and dry. No rash noted.   Cardiovascular: Normal heart rate noted  Respiratory: Normal respiratory effort, no problems with respiration noted  Abdomen: Soft, gravid, appropriate for gestational age.  Pain/Pressure: Absent     Pelvic: Cervical exam deferred        Extremities: Normal range of motion.  Edema: None  Mental Status: Normal mood and affect. Normal behavior. Normal judgment and thought content.   Assessment and Plan:  Pregnancy: G1P0 at [redacted]w[redacted]d 1. Supervision of other normal pregnancy, antepartum Patient is doing well without complaints Follow up anatomy ultrasound scheduled on 7/29 Panorama and AFP today  2. Maternal obesity affecting pregnancy, antepartum ASA daily- Rx provided  Preterm labor  symptoms and general obstetric precautions including but not limited to vaginal bleeding, contractions, leaking of fluid and fetal movement were reviewed in detail with the patient. Please refer to After Visit Summary for other counseling recommendations.   Return in about 4 weeks (around 05/15/2021) for in person, ROB, High risk.  Future Appointments  Date Time Provider [place]  05/04/2021 10:30 AM [facility] US[facility]    Mora Bellman, MD

## 2021-04-17 NOTE — Progress Notes (Signed)
Pt c/o pain in the right foot when standing for long periods.

## 2021-04-19 LAB — AFP, SERUM, OPEN SPINA BIFIDA
AFP MoM: 0.69
AFP Value: 21.4 ng/mL
Gest. Age on Collection Date: 16.3 weeks
Maternal Age At EDD: 20.4 yr
OSBR Risk 1 IN: 10000
Test Results:: NEGATIVE
Weight: 227 [lb_av]

## 2021-04-19 LAB — URINE CULTURE

## 2021-04-26 ENCOUNTER — Encounter: Payer: Self-pay | Admitting: Obstetrics and Gynecology

## 2021-05-04 ENCOUNTER — Ambulatory Visit: Payer: Medicaid Other | Attending: Advanced Practice Midwife

## 2021-05-15 ENCOUNTER — Encounter: Payer: Medicaid Other | Admitting: Obstetrics and Gynecology

## 2021-05-21 ENCOUNTER — Encounter: Payer: Self-pay | Admitting: Obstetrics and Gynecology

## 2021-05-21 ENCOUNTER — Ambulatory Visit (INDEPENDENT_AMBULATORY_CARE_PROVIDER_SITE_OTHER): Payer: Medicaid Other | Admitting: Obstetrics and Gynecology

## 2021-05-21 ENCOUNTER — Other Ambulatory Visit: Payer: Self-pay

## 2021-05-21 VITALS — BP 120/74 | HR 79 | Wt 242.0 lb

## 2021-05-21 DIAGNOSIS — Z3A21 21 weeks gestation of pregnancy: Secondary | ICD-10-CM | POA: Insufficient documentation

## 2021-05-21 DIAGNOSIS — Z3480 Encounter for supervision of other normal pregnancy, unspecified trimester: Secondary | ICD-10-CM

## 2021-05-21 NOTE — Progress Notes (Signed)
   PRENATAL VISIT NOTE  Subjective:  Phyllis Christian is a 20 y.o. G1P0 at 68w2dbeing seen today for ongoing prenatal care.  She is currently monitored for the following issues for this high-risk pregnancy and has Urticaria pigmentosa; Childhood obesity, BMI 95-100 percentile; Sickle cell trait ([place]; Painful menstrual periods; Obstructive sleep apnea; Mild intermittent asthma without complication; Vitamin D deficiency; Contact dermatitis; Acanthosis nigricans; Supervision of other normal pregnancy, antepartum; Maternal obesity affecting pregnancy, antepartum; and [redacted] weeks gestation of pregnancy on their problem list.  Patient doing well with no acute concerns today. She reports no complaints.  Contractions: Not present. Vag. Bleeding: None.  Movement: Present. Denies leaking of fluid.   Discussed baby ASA, she has not started taking it.  Pt advised to monitor reaction closely due to her hx of asthma as well.  The following portions of the patient's history were reviewed and updated as appropriate: allergies, current medications, past family history, past medical history, past social history, past surgical history and problem list. Problem list updated.  Objective:   Vitals:   05/21/21 1107  BP: 120/74  Pulse: 79  Weight: 242 lb (109.8 kg)    Fetal Status: Fetal Heart Rate (bpm): 145   Movement: Present     General:  Alert, oriented and cooperative. Patient is in no acute distress.  Skin: Skin is warm and dry. No rash noted.   Cardiovascular: Normal heart rate noted  Respiratory: Normal respiratory effort, no problems with respiration noted  Abdomen: Soft, gravid, appropriate for gestational age.  Pain/Pressure: Absent     Pelvic: Cervical exam deferred        Extremities: Normal range of motion.  Edema: None  Mental Status:  Normal mood and affect. Normal behavior. Normal judgment and thought content.   Assessment and Plan:  Pregnancy: G1P0 at 224w2d1. Supervision of other normal  pregnancy, antepartum Continue routine care, reschedule anatomy scan  2. [redacted] weeks gestation of pregnancy   Preterm labor symptoms and general obstetric precautions including but not limited to vaginal bleeding, contractions, leaking of fluid and fetal movement were reviewed in detail with the patient.  Please refer to After Visit Summary for other counseling recommendations.   Return in about 4 weeks (around 06/18/2021) for ROB, in person.   LaLynnda ShieldsMD Faculty Attending Center for [facility]

## 2021-05-21 NOTE — Progress Notes (Signed)
Pt presents for ROB without complaints today.  Pt missed anatomy u/s appt; she agrees to reschedule the appt.  Panorama insufficient fetal DNA 03/20/21 & 04/17/21

## 2021-06-14 ENCOUNTER — Ambulatory Visit (HOSPITAL_BASED_OUTPATIENT_CLINIC_OR_DEPARTMENT_OTHER): Payer: Medicaid Other | Admitting: Maternal & Fetal Medicine

## 2021-06-14 ENCOUNTER — Other Ambulatory Visit: Payer: Self-pay

## 2021-06-14 ENCOUNTER — Other Ambulatory Visit: Payer: Self-pay | Admitting: Advanced Practice Midwife

## 2021-06-14 ENCOUNTER — Other Ambulatory Visit: Payer: Self-pay | Admitting: *Deleted

## 2021-06-14 ENCOUNTER — Ambulatory Visit: Payer: Medicaid Other | Attending: Advanced Practice Midwife

## 2021-06-14 DIAGNOSIS — Z363 Encounter for antenatal screening for malformations: Secondary | ICD-10-CM | POA: Diagnosis not present

## 2021-06-14 DIAGNOSIS — D573 Sickle-cell trait: Secondary | ICD-10-CM | POA: Insufficient documentation

## 2021-06-14 DIAGNOSIS — O280 Abnormal hematological finding on antenatal screening of mother: Secondary | ICD-10-CM | POA: Diagnosis not present

## 2021-06-14 DIAGNOSIS — O99212 Obesity complicating pregnancy, second trimester: Secondary | ICD-10-CM | POA: Diagnosis not present

## 2021-06-14 DIAGNOSIS — Z1371 Encounter for nonprocreative screening for genetic disease carrier status: Secondary | ICD-10-CM

## 2021-06-14 DIAGNOSIS — O283 Abnormal ultrasonic finding on antenatal screening of mother: Secondary | ICD-10-CM

## 2021-06-14 DIAGNOSIS — Z3A19 19 weeks gestation of pregnancy: Secondary | ICD-10-CM | POA: Insufficient documentation

## 2021-06-14 DIAGNOSIS — O409XX0 Polyhydramnios, unspecified trimester, not applicable or unspecified: Secondary | ICD-10-CM

## 2021-06-14 DIAGNOSIS — O402XX1 Polyhydramnios, second trimester, fetus 1: Secondary | ICD-10-CM | POA: Diagnosis not present

## 2021-06-14 DIAGNOSIS — O99012 Anemia complicating pregnancy, second trimester: Secondary | ICD-10-CM | POA: Insufficient documentation

## 2021-06-14 DIAGNOSIS — Z6841 Body Mass Index (BMI) 40.0 and over, adult: Secondary | ICD-10-CM

## 2021-06-14 NOTE — Progress Notes (Signed)
MFM Brief Note  Ms. Bender is a 20 year old G1 P0 at 24 weeks and 5 days who is here for a detailed anatomy.   Her pregnancy is complicated by elevated BMI 40 and two insufficient fetal fraction for cell free DNA.  A single intrauterine pregnancy was observed. Normal anatomy with measurements consistent with dates There is good fetal movement and mild polyhydramnios Suboptimal views of the fetal anatomy were obtained secondary to fetal position.  An echogenic intracardiac focus was observed today. I discussed that there is no increased risk to the function or structure of the heart.   I reviewed that an ultrasound is a screening exam and that a diagnostic exam via amnioticenetesis is the only test available to provide a definitive result. At this time given the increased 1:500 to 1:1000 risk for perinatal loss secondary to vaginal bleeding, loss of fluid, and infection, Ms. Kleier declined for additional testing.  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 We discussed the 1:4 chance of the fetus inheriting sickle cell disease. They also opted for genetic counseling.   At the conclusion of today's visit Ms. Podgorski expressed a desire to forgo any additional testing and to continue serial growth exams.  A follow up exam was scheduled in 4 weeks.  All questions answered.  I spent 30 minutes with > 50 % in face to face consultation.   Vikki Ports, MD.

## 2021-06-17 IMAGING — US US OB COMP LESS 14 WK
1 series · 15 of 28 positions shown · non-contrast
Comparison: None

CLINICAL DATA: Spotting when wiping and cramping in a 19-year-old
female with positive pregnancy test, gestational age by last
menstrual period of 11 weeks 3 days. Beta HCG currently pending.

EXAM:
OBSTETRIC <14 WK US AND TRANSVAGINAL OB US
TECHNIQUE: Both transabdominal and transvaginal ultrasound examinations were
performed for complete evaluation of the gestation as well as the
maternal uterus, adnexal regions, and pelvic cul-de-sac.
Transvaginal technique was performed to assess early pregnancy.

[Series 1: us ob comp less 14 wk · 15 of 45 slices shown]
[im 1/45]
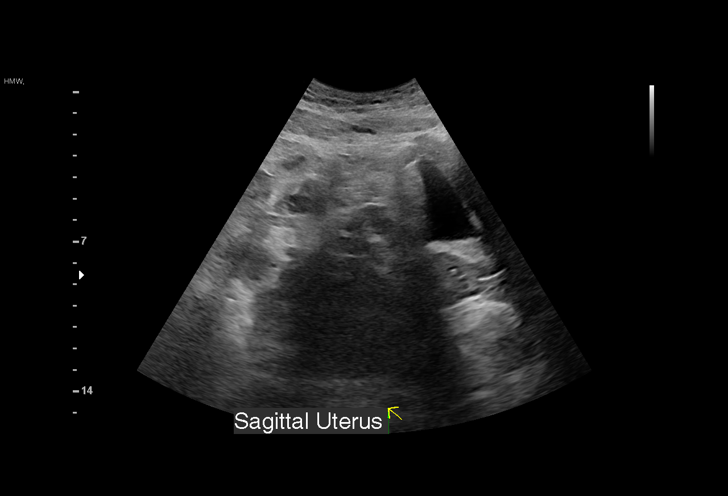
[im 4/45]
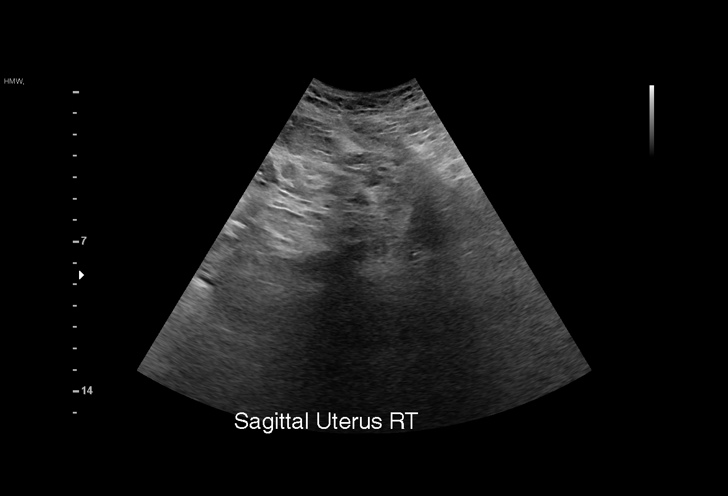
[im 7/45]
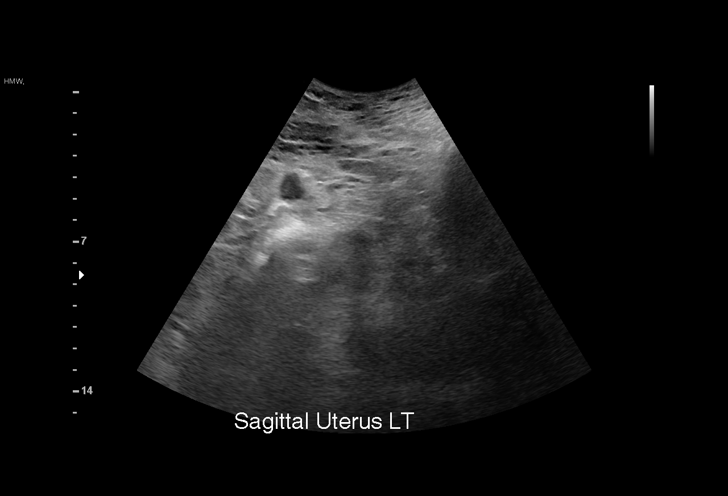
[im 10/45]
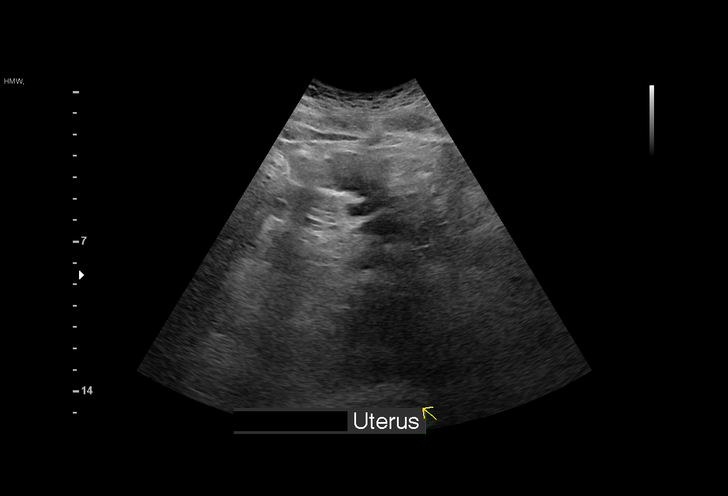
[im 14/45]
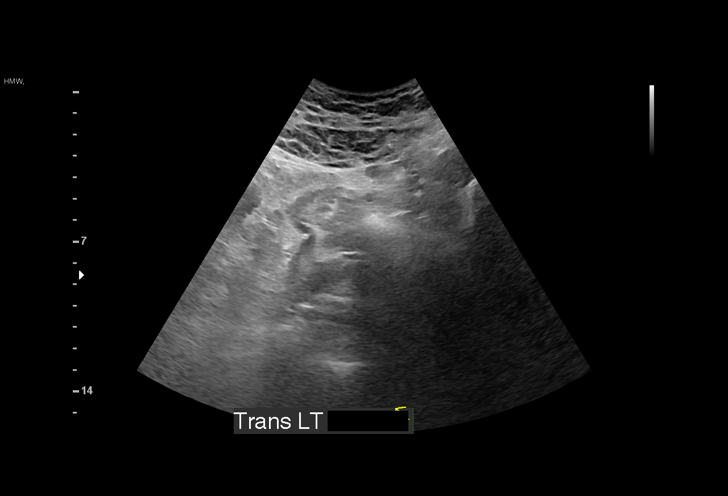
[im 17/45]
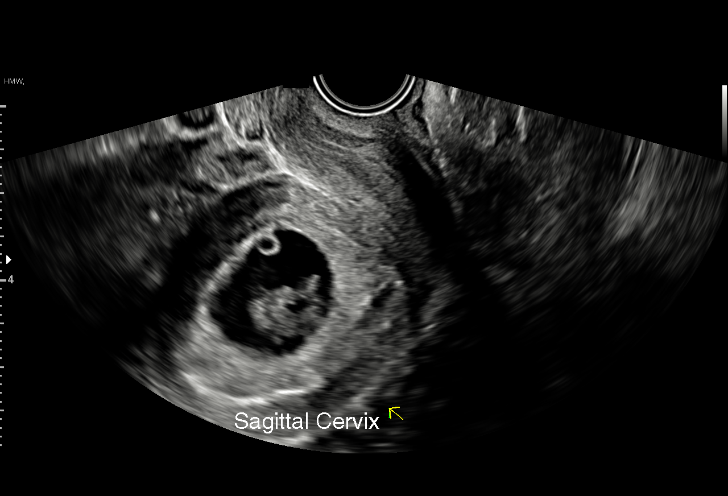
[im 20/45]
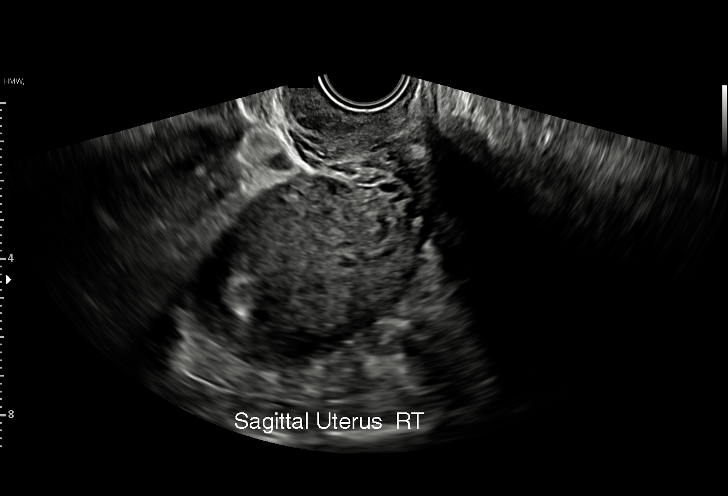
[im 23/45]
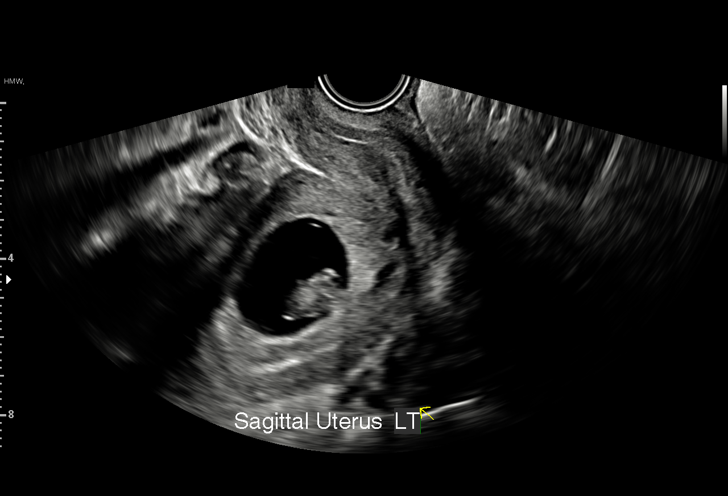
[im 25/45]
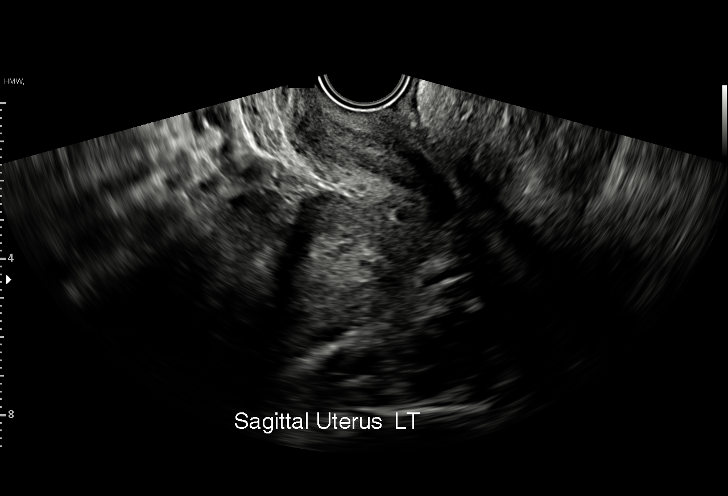
[im 28/45]
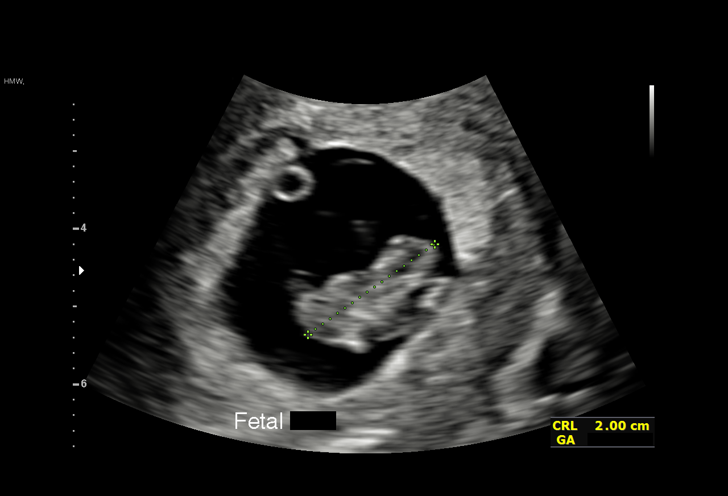
[im 31/45]
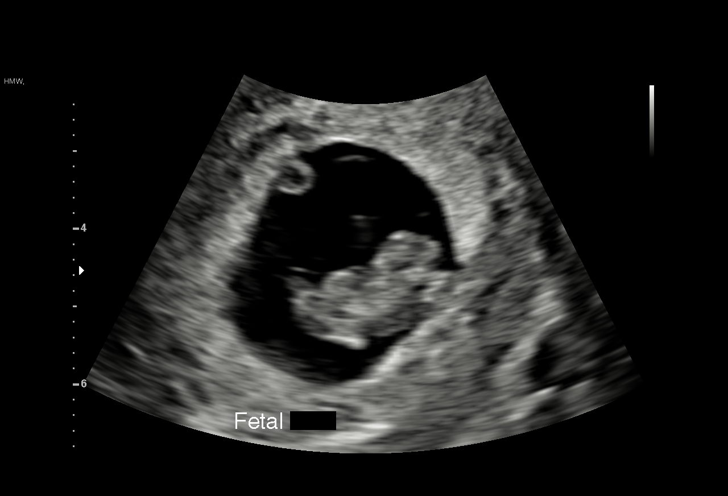
[im 35/45]
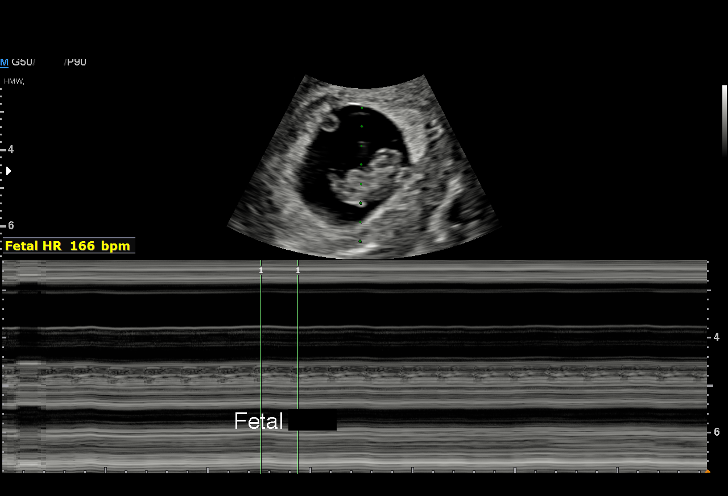
[im 38/45]
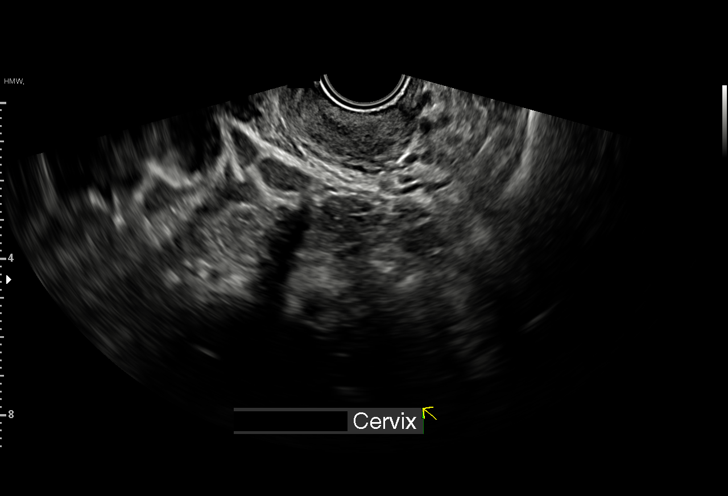
[im 41/45]
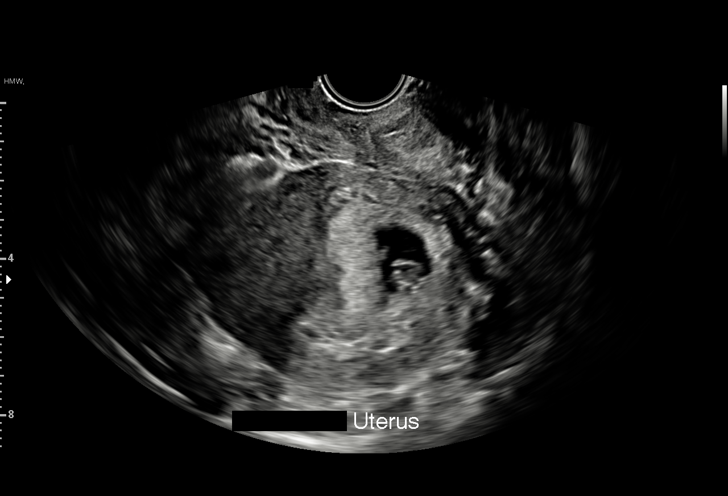
[im 45/45]
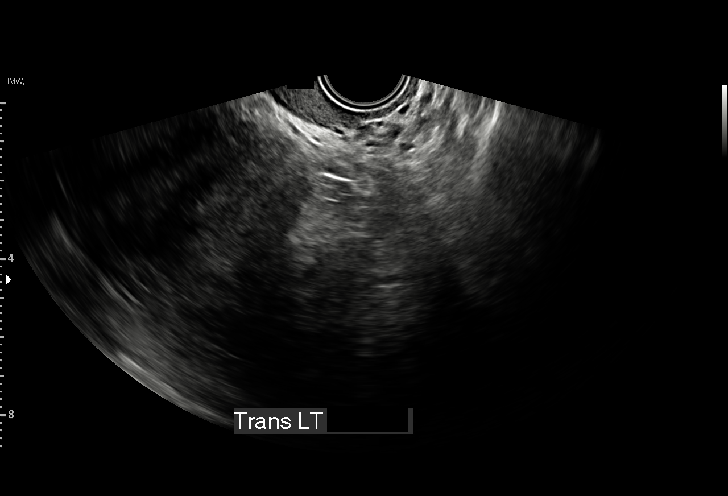

[15 of 28 positions shown; findings below may reference images not displayed]

FINDINGS: Intrauterine gestational sac: Single

Yolk sac:  Visualized

Embryo:  Visualized

Cardiac Activity: Visualized

Heart Rate: 166 bpm

CRL: 20.3 mm   8 w   4 d                  US EDC: 09/29/2021

Subchorionic hemorrhage:  None visualized.

Maternal uterus/adnexae: No free fluid in the pelvis. The ovaries
are not visualized. No visible abnormality in the adnexa.
IMPRESSION: Single intrauterine pregnancy at 8 weeks 4 days by crown-rump length
with heart rate of 166 beats per minute.

Ovaries are not visualized.

## 2021-06-18 ENCOUNTER — Ambulatory Visit (INDEPENDENT_AMBULATORY_CARE_PROVIDER_SITE_OTHER): Payer: Medicaid Other | Admitting: Obstetrics and Gynecology

## 2021-06-18 ENCOUNTER — Other Ambulatory Visit: Payer: Self-pay

## 2021-06-18 ENCOUNTER — Encounter: Payer: Self-pay | Admitting: Obstetrics and Gynecology

## 2021-06-18 VITALS — BP 125/74 | HR 91 | Wt 257.0 lb

## 2021-06-18 DIAGNOSIS — Z3480 Encounter for supervision of other normal pregnancy, unspecified trimester: Secondary | ICD-10-CM

## 2021-06-18 DIAGNOSIS — O99210 Obesity complicating pregnancy, unspecified trimester: Secondary | ICD-10-CM

## 2021-06-18 NOTE — Progress Notes (Signed)
+   Fetal movement. No complaints.

## 2021-06-18 NOTE — Progress Notes (Signed)
   PRENATAL VISIT NOTE  Subjective:  Phyllis Christian is a 20 y.o. G1P0 at 31w2dbeing seen today for ongoing prenatal care.  She is currently monitored for the following issues for this low-risk pregnancy and has [place]ickle cell trait ([place]; Obstructive sleep apnea; Mild intermittent asthma without complication; Supervision of other normal pregnancy, antepartum; and Maternal obesity affecting pregnancy, antepartum on their problem list.  Patient reports no complaints.  Contractions: Not present. Vag. Bleeding: None.  Movement: Present. Denies leaking of fluid.   The following portions of the patient's history were reviewed and updated as appropriate: allergies, current medications, past family history, past medical history, past social history, past surgical history and problem list.   Objective:   Vitals:   06/18/21 1116  BP: 125/74  Pulse: 91  Weight: 257 lb (116.6 kg)    Fetal Status: Fetal Heart Rate (bpm): 162 Fundal Height: 26 cm Movement: Present     General:  Alert, oriented and cooperative. Patient is in no acute distress.  Skin: Skin is warm and dry. No rash noted.   Cardiovascular: Normal heart rate noted  Respiratory: Normal respiratory effort, no problems with respiration noted  Abdomen: Soft, gravid, appropriate for gestational age.  Pain/Pressure: Absent     Pelvic: Cervical exam deferred        Extremities: Normal range of motion.  Edema: None  Mental Status: Normal mood and affect. Normal behavior. Normal judgment and thought content.   Assessment and Plan:  Pregnancy: G1P0 at 291w2d. Supervision of other normal pregnancy, antepartum Patient is doing well without complaints Peds list provided Third trimester labs and glucola next visit  2. Maternal obesity affecting pregnancy, antepartum Follow up growth ultrasound scheduled  Preterm labor symptoms and general obstetric precautions including but not limited to vaginal bleeding, contractions, leaking of fluid and  fetal movement were reviewed in detail with the patient. Please refer to After Visit Summary for other counseling recommendations.   Return in about 4 weeks (around 07/16/2021) for in person, ROB, Low risk, 2 hr glucola next visit.  Future Appointments  Date Time Provider [place]9/13/2022  9:00 AM [facility] GENETIC COUNSELING [facility]21 10:30 AM [facility] NURSE [facility]2021 10:45 AM [facility] US5 [facility] [place]  PeMora BellmanMD

## 2021-06-18 NOTE — Patient Instructions (Signed)
AREA PEDIATRIC/FAMILY PRACTICE PHYSICIANS  [place] ([address]) [facility] Chambliss, MD; Eniola, MD; Hale, MD; Hensel, MD; McDiarmid, MD; McIntyer, MD; Neal, MD; Walden, MD [address] [place], [place] [address] [phone] Mon-Fri 8:30-12:30, 1:30-5:00 Providers come to see babies at [facility] Accepting Medicaid [facility] providers who accept newborns: Koirala, MD; Morrow, MD; Wolters, MD [address], [place], [place] [address] [phone] Mon-Fri 8:00-5:30 Babies seen by providers at [facility] Does NOT accept Medicaid Please call early in hospitalization for appointment (limited availability)  [facility] Mulberry, MD [address] [place], [place] [address] [phone] Mon, Tue, Thur, Fri 8:30-5:00, Wed 10:00-7:00 (closed 1-2pm) Babies seen by [facility] providers Accepting Medicaid Rubin - Pediatrician Rubin, MD [address], [place], [place] [address] [phone] Mon-Fri 8:30-5:00, Sat 8:30-12:00 Provider comes to see babies at [facility] Accepting Medicaid Must have been referred from current patients or contacted office prior to delivery [facility] ([facility]) Brown, MD; Chandler, MD; Ettefagh, MD; Grant, MD; Lester, MD; McCormick, MD; McQueen, MD; Prose, MD; Simha, MD; Stanley, MD; Stryffeler, NP; Tebben, NP [address], [place], [place] [address] [phone] Mon, Tue, Thur, Fri 8:30-5:30, Wed 9:30-5:30, Sat 8:30-12:30 Babies seen by [facility] providers Accepting Medicaid Only accepting infants of first-time parents or siblings of current patients Hospital discharge coordinator will make follow-up appointment Jack Amos [address], [place], [place]  [address] [phone]   Fax - [phone] [facility] [address], [place], [place]  [address] Phone - [phone]   Fax - [phone] Shilpa Gosrani [address], [place], [place]  [address] [phone]  [place] ([address]) [facility] Bates, MD; Brassfield, MD; Cooper, Cox, MD; MD; Davis, MD; Dovico, MD; Ettefaugh, MD; Little, MD; Lowe, MD; Keiffer, MD; Melvin, MD; Sumner, MD; Williams, MD [address], [place], [place] [address] [phone] Mon-Fri 8:30-5:00 (extended evenings Mon-Thur as needed), Sat-Sun 10:00-1:00 Providers come to see babies at [facility] Accepting Medicaid for families of first-time babies and families with all children in the household age 3 and under. Must register with office prior to making appointment (M-F only). [facility] Henson, NP; Knapp, MD; Lalonde, MD; Tysinger, PA [address] [place], [place] [address] [phone] Mon-Fri 8:00-5:00 Babies seen by providers at [facility] Does NOT accept Medicaid/Commercial Insurance Only [facility] ([facility])  Artis, MD; Barnes, MD; Bratton, MD; Coccaro, MD; Lockett Gardner, MD; Kramer, MD; Marshall, MD; Netherton, MD; Poleto, MD; Skinner, MD [address] [place], [place] [address] [phone] Mon-Fri 8:30-5:30, Sat (Oct.-Mar.) 9:00-1:00 Babies seen by providers at [facility] Accepting Medicaid  [place] ([address]) [facility] Reid, MD; Warner, MD [address], [place], [place] [address] [phone] Mon-Fri 8:30-5:00, Sat 8:30-12:00 Providers come to see babies at [facility] Does NOT accept Medicaid [facility] Becker, PA; Hagler, MD; Scifres, PA; Sun, MD; Swayne, MD [address], [place], [place] [address] [phone] Mon-Fri 8:00-5:00 Babies seen by providers at [facility] Does NOT accept Medicaid Only accepting babies of parents who  are patients Please call early in hospitalization for appointment (limited availability) [facility] Clark, MD; Frye, MD; Kelleher, MD; Mack, NP; Miller, MD; O'Keller, MD; Patterson, NP; Pudlo, MD; Puzio, MD; Thomas, MD; Tucker, MD; Twiselton, MD [address], [place], [place] [address] [phone] Mon-Fri 8:00-5:00, Sat 9:00-12:00 Providers come to see babies at [facility] Does NOT accept Medicaid  [place] ([address]) [facility] providers accepting new patients: Brake, NP; Wharton, PA [address], [place], [place] [address] [phone] Mon-Fri 8:00-5:00 Babies seen by providers at [facility] Does NOT accept Medicaid Only accepting babies of parents who are patients Please call early in hospitalization for appointment (limited availability) [facility] Gay, MD; Quinlan, MD [address] [place], [place] [address] [phone] (press 1 to schedule appointment) Mon-Fri 8:00-5:00 Providers come to see babies at [facility] Does NOT accept Medicaid [facility] Mazer, MD [address] [place], [place] [address] [phone] Mon-Fri 8:30-5:00 (lunch 12:30-1:00), extended hours by appointment only Wed 5:00-6:30 Babies seen by [facility] providers Accepting Medicaid [facility] Banks, MD; Jordan, MD; Koberlein, MD [address], [place], [place] [address] [phone] Mon-Fri 8:00-5:00 Babies seen by [facility] providers Does NOT accept Medicaid [facility] Parker, MD; Hunter, MD; Wallace, DO [address] [place], [place] [address] [phone] Mon-Fri 8:00-5:00 Babies seen by [facility] providers Does NOT accept Medicaid [facility] Brandon, PA; Brecken, PA; Christy, NP; Dees, MD; DeClaire, MD; DeWeese, MD; Hansen, NP; Mills, NP; Parrish, NP; Smoot, NP; Summer, MD; Vapne,  MD [address] [place], [place] [address] [phone] Mon-Fri 8:30-5:00, Sat 10:00-1:00 Providers come to see babies at [facility] Does NOT accept Medicaid Free prenatal information session Tuesdays at 4:45pm [facility] Bouska, MD; Gordon, PA; Jeffery, PA; Weber, PA [address] [place] [address] [phone] Mon-Fri 7:30-5:30 Babies seen by [facility] providers [facility] [address], [place], [place]  [address] [phone]   Fax - [phone]  [place] ([address] & [address]) [facility] [place] [address] [place], [place] [address] [phone] Mon-Thur 8:00-6:00 Providers come to see babies at [facility] Accepting Medicaid [facility] Anderson, NP; Badger, MD; Beal, PA; Spencer, PA [address] [place], [place] [address] [phone] Mon-Thur 7:30-7:30, Fri 7:30-4:30 Babies seen by [facility] providers Accepting Medicaid [facility] Agbuya, MD; Klett, NP; Romgoolam, MD [address], [place], [place] [address] [phone] Mon-Fri 8:30-5:00, Sat 8:30-12:00 Providers come to see babies at [facility] Accepting Medicaid Must have "Meet & Greet" appointment at office prior to delivery [facility] McCord, MD; Wallace, MD; Wood, MD [address], [place], [place] [address] [phone] Mon-Wed 8:00-6:00, Thur-Fri 8:00-5:00, Sat 9:00-12:00 Providers come to see babies at [facility] Does NOT accept Medicaid Only accepting siblings of current patients [facility]  [address], [place], [place]  [address] [phone]   Fax - [phone] [facility] [address], [place], [place]  [address] [phone]   Fax -  [phone]  [place] ([address] & [address]) [facility] Cirigliano, DO; Matthews, DO [address] [place], [place] [address] [phone] Mon-Fri 7:00-5:00 Babies seen by [facility] providers Does NOT accept Medicaid [facility] Briscoe, MD; Howley, PA; Moreira, PA [address], [place], [place] [address] [phone] Mon-Fri 8:00-5:00 Babies seen by [facility] providers Accepting Medicaid [facility] Boyd, MD; Church, PA; Jones, NP; Osborn, PA [address], , [place] [address] [phone] Mon-Fri 8:00-5:00 Babies seen by providers at [facility] Accepting Medicaid  [place] ([address]) [facility] [place] Wendling, DO [address] [place], [place] [address] [phone] Mon-Fri 8:00-5:00 Babies seen by [facility] providers Does NOT accept Medicaid Limited availability, please call early in hospitalization to schedule follow-up [facility] Calderon, PA; Cummings, MD; Dillard, MD; Martin, PA; Olson, MD; VanDeven, PA [address], [place], [place] [address] [phone] Mon-Fri 8:30-5:00, Sat 9:00-12:00 Babies seen by providers at [facility] Accepting Medicaid Please register online then schedule online or call office [email or web address] Hunter, NP; Kumar, MD; Martin Rogers, PA [address], [place], [place] [address] [phone] Mon-Fri 8:00-5:00 Babies seen by providers at [facility] Accepting Medicaid [facility] ([facility]) Castleberry, MD; Kristi Fleenor, NP; West, MD [address], [place], [place] [address] [phone] Mon-Fri 8:00-5:30, Sat&Sun by appointment (phones open at  8:30) Babies seen by [facility] providers Accepting Medicaid Must be a first-time baby or sibling of current patient [facility] [place]  [address], [place], [place]  [address] [phone]   Fax - [phone]  [place] ([address]) [facility] Brown, PA; Cowen, PA; Rice, MD; Helton, PA; Spry, MD [address] [place], [place] [address] [phone] Mon-Thur 8:00-7:00, Fri 8:00-5:00, Sat 8:00-12:00, Sun 9:00-12:00 Babies seen by [facility] providers Accepting Medicaid [facility] Coe-Goins, MD; Marshall, MD; Pierre-Louis, MD [address], [place], [place] [address] [phone] Mon-Thur 8:00-5:00 Babies seen by providers at [facility] Accepting Medicaid [facility] Bratton, MD; Coe-Goins, MD; Hayes, MD; Lewis, MD; List, MD; Lott, MD; Marshall, MD; Moran, MD; O'Neal, MD; Pierre-Louis, MD; Pitonzo, MD; Scholer, MD; Spangle, MD [address] [place], [place] [address] [phone] Mon-Fri 8:00-5:30, Sat (Oct.-Mar.) 9:00-1:00 Babies seen by providers at [facility] Accepting Medicaid Must fill out new patient packet, available online at [email or web address] [facility] ([facility]) Friddle, NP; Harris, NP; Kelly, NP; Logan, MD; Melvin, PA; Poth, MD; Ramadoss, MD; Stanton, NP [address], [place], [place] [address] [phone] Mon-Thur 8:00-5:30, Fri 8:00-5:00 Babies seen by providers at [facility] Accepting Medicaid  [place] ([address]) [facility] Dixon, PA; [place], MD; Pickard, MD; Tapia, PA [address], [place], [place] [address] [phone] Mon-Fri 8:00-5:00 Babies seen by providers at [facility] Accepting Medicaid   [place] ([address]) [facility] [place] Masneri, DO; Meyers, MD; Nelson, PA [address], [place], [place] [address] [phone] Mon-Fri 8:00-5:00 Babies seen by providers at [facility] Does NOT accept Medicaid Limited appointment availability, please call early in hospitalization  [facility] [place] Kunedd, DO; McGowen, MD [address], [place], [place] [address] [phone] Mon-Fri 8:00-5:00 Babies seen by [facility] providers Does NOT accept Medicaid [facility] [place] Cameron, MD; MacDonald, MD; Michaels, PA; Nayak, MD [address] [place] [address], [place], [place] [address] [phone] Mon-Fri 8:00-5:00 After hours clinic ([address] [place], [place] [address]) [phone] Mon-Fri 5:00-8:00, Sat 12:00-6:00, Sun 10:00-4:00 Babies seen by [facility] providers Accepting Medicaid [facility] [place] [address], [place], [place]  [address] [phone]   Fax - [phone]  [place] ([address]) [facility] [place] [facility] Andy, MD [address], [place], [place] [address] [phone] Mon-Fri 8:00-5:00 Babies seen by [facility] providers Does NOT accept Medicaid [facility] [place] ([facility] [place]) Eksir, MD [address], [place], [place] [address] [phone] Mon-Thur 8:00-7:00, Fri 8:00-5:00, Sat 8:00-12:00 Babies seen by providers at [facility] Accepting Medicaid - but does not have vaccinations in office (must be received elsewhere) Limited availability, please call early in hospitalization  [place] ([address]) [facility]  Charlene Flemming, MD [address],  [place] [address] [phone]  Fax [phone]  [facility]  Kimberly [place], MD, Annamarie Streilein, PA, Carla Hampton, PA [address] [place], [place]  [address] [phone] [facility]  [address], [place], [place] [address] [phone] [address], [place], [place] [address] [phone] (West Office)  [facility] [address], [place], [place] [address] [phone] [facility] [address] [place], [place], [place] [address] [phone] [facility] [address], [place], [place] [address] [phone] [facility] [address], [place], [place] [address] [phone] [facility] [address], [place], [place] [address] [phone] [facility] [address], [place], [place] [address] [phone] [facility]  [address], [place], [place] [address] [phone] Dr[facility] [address], [place], [place] [address] [phone] [facility] [address], [place], [place] [address] [phone] [facility] [address] [place] [address], [place],  [address] [phone]  

## 2021-06-19 ENCOUNTER — Ambulatory Visit: Payer: Medicaid Other

## 2021-06-20 ENCOUNTER — Encounter: Payer: Self-pay | Admitting: Advanced Practice Midwife

## 2021-06-20 DIAGNOSIS — O283 Abnormal ultrasonic finding on antenatal screening of mother: Secondary | ICD-10-CM | POA: Insufficient documentation

## 2021-06-26 ENCOUNTER — Ambulatory Visit: Payer: Medicaid Other | Attending: Advanced Practice Midwife

## 2021-07-12 ENCOUNTER — Encounter: Payer: Self-pay | Admitting: *Deleted

## 2021-07-12 ENCOUNTER — Ambulatory Visit: Payer: Medicaid Other | Attending: Maternal & Fetal Medicine

## 2021-07-12 ENCOUNTER — Ambulatory Visit: Payer: Medicaid Other | Admitting: *Deleted

## 2021-07-12 ENCOUNTER — Other Ambulatory Visit: Payer: Self-pay

## 2021-07-12 VITALS — BP 132/60 | HR 80

## 2021-07-12 DIAGNOSIS — O283 Abnormal ultrasonic finding on antenatal screening of mother: Secondary | ICD-10-CM

## 2021-07-12 DIAGNOSIS — Z3A28 28 weeks gestation of pregnancy: Secondary | ICD-10-CM | POA: Insufficient documentation

## 2021-07-12 DIAGNOSIS — O409XX0 Polyhydramnios, unspecified trimester, not applicable or unspecified: Secondary | ICD-10-CM

## 2021-07-12 DIAGNOSIS — O99210 Obesity complicating pregnancy, unspecified trimester: Secondary | ICD-10-CM

## 2021-07-12 DIAGNOSIS — E669 Obesity, unspecified: Secondary | ICD-10-CM

## 2021-07-12 DIAGNOSIS — O99213 Obesity complicating pregnancy, third trimester: Secondary | ICD-10-CM | POA: Insufficient documentation

## 2021-07-12 DIAGNOSIS — O403XX0 Polyhydramnios, third trimester, not applicable or unspecified: Secondary | ICD-10-CM | POA: Insufficient documentation

## 2021-07-12 DIAGNOSIS — Z6841 Body Mass Index (BMI) 40.0 and over, adult: Secondary | ICD-10-CM | POA: Diagnosis present

## 2021-07-12 DIAGNOSIS — Z148 Genetic carrier of other disease: Secondary | ICD-10-CM | POA: Insufficient documentation

## 2021-07-13 ENCOUNTER — Other Ambulatory Visit: Payer: Self-pay | Admitting: *Deleted

## 2021-07-13 DIAGNOSIS — O403XX0 Polyhydramnios, third trimester, not applicable or unspecified: Secondary | ICD-10-CM

## 2021-07-18 ENCOUNTER — Ambulatory Visit (INDEPENDENT_AMBULATORY_CARE_PROVIDER_SITE_OTHER): Payer: Medicaid Other | Admitting: Obstetrics and Gynecology

## 2021-07-18 ENCOUNTER — Other Ambulatory Visit: Payer: Self-pay

## 2021-07-18 ENCOUNTER — Other Ambulatory Visit: Payer: Self-pay | Admitting: Pediatrics

## 2021-07-18 ENCOUNTER — Other Ambulatory Visit: Payer: Medicaid Other

## 2021-07-18 ENCOUNTER — Encounter: Payer: Self-pay | Admitting: Obstetrics and Gynecology

## 2021-07-18 VITALS — BP 123/64 | HR 74 | Wt 252.0 lb

## 2021-07-18 DIAGNOSIS — Z3480 Encounter for supervision of other normal pregnancy, unspecified trimester: Secondary | ICD-10-CM

## 2021-07-18 DIAGNOSIS — D573 Sickle-cell trait: Secondary | ICD-10-CM

## 2021-07-18 DIAGNOSIS — Z3A29 29 weeks gestation of pregnancy: Secondary | ICD-10-CM

## 2021-07-18 DIAGNOSIS — J4541 Moderate persistent asthma with (acute) exacerbation: Secondary | ICD-10-CM

## 2021-07-18 DIAGNOSIS — O99019 Anemia complicating pregnancy, unspecified trimester: Secondary | ICD-10-CM | POA: Diagnosis not present

## 2021-07-18 DIAGNOSIS — O99210 Obesity complicating pregnancy, unspecified trimester: Secondary | ICD-10-CM

## 2021-07-18 NOTE — Progress Notes (Signed)
ROB/GTT. Declined FLU and TDAP vaccines.

## 2021-07-18 NOTE — Progress Notes (Signed)
   LOW-RISK PREGNANCY OFFICE VISIT Patient name: Phyllis Christian MRN [id number]  Date of birth: 10-Oct-2000 Chief Complaint:   Routine Prenatal Visit  History of Present Illness:   Phyllis Christian is a 20 y.o. G1P0 female at [redacted]w[redacted]d with an Estimated Date of Delivery: 09/29/21 being seen today for ongoing management of a low-risk pregnancy.  Today she reports no complaints. Contractions: Not present. Vag. Bleeding: None.  Movement: Present. denies leaking of fluid. Review of Systems:   Pertinent items are noted in HPI Denies abnormal vaginal discharge w/ itching/odor/irritation, headaches, visual changes, shortness of breath, chest pain, abdominal pain, severe nausea/vomiting, or problems with urination or bowel movements unless otherwise stated above. Pertinent History Reviewed:  Reviewed past medical,surgical, social, obstetrical and family history.  Reviewed problem list, medications and allergies. Physical Assessment:   Vitals:   07/18/21 0825  BP: 123/64  Pulse: 74  Weight: 252 lb (114.3 kg)  Body mass index is 41.93 kg/m.        Physical Examination:   General appearance: Well appearing, and in no distress  Mental status: Alert, oriented to person, place, and time  Skin: Warm & dry  Cardiovascular: Normal heart rate noted  Respiratory: Normal respiratory effort, no distress  Abdomen: Soft, gravid, nontender  Pelvic: Cervical exam deferred         Extremities: Edema: None  Fetal Status: Fetal Heart Rate (bpm): 155 Fundal Height: 32 cm Movement: Present    No results found for this or any previous visit (from the past 24 hour(s)).  Assessment & Plan:  1) Low-risk pregnancy G1P0 at [redacted]w[redacted]d with an Estimated Date of Delivery: 09/29/21   2) Supervision of other normal pregnancy, antepartum  - Glucose Tolerance, 2 Hours w/1 Hour,  - RPR,  - CBC,  - HIV antibody (with reflex)  3) Maternal obesity affecting pregnancy, antepartum - Not taking bASA -- "I haven't picked up the Rx" -  Notified of the importance of taking bASA for prevention of PEC in pregnancy  4) [redacted] weeks gestation of pregnancy  5) Sickle cell trait in mother affecting pregnancy ([place])  - Culture, OB Urine    Meds: No orders of the defined types were placed in this encounter.  Labs/procedures today: 2 hr GTT, 3rd trimester labs, UCx  Plan:  Continue routine obstetrical care   Reviewed: Preterm labor symptoms and general obstetric precautions including but not limited to vaginal bleeding, contractions, leaking of fluid and fetal movement were reviewed in detail with the patient.  All questions were answered. Has home bp cuff. Check bp weekly, let us know if >140/90.   Follow-up: Return in about 4 weeks (around 08/15/2021) for Return OB visit.  Orders Placed This Encounter  Procedures   Culture, OB Urine   Glucose Tolerance, 2 Hours w/1 Hour   RPR   CBC   HIV antibody (with reflex)   Laury Deep MSN, CNM 07/18/2021 8:36 AM

## 2021-07-18 NOTE — Patient Instructions (Signed)
Third Trimester of Pregnancy The third trimester of pregnancy is from week 28 through week 32. This is months 7 through 9. The third trimester is a time when the unborn baby (fetus) is growing rapidly. At the end of the ninth month, the fetus is about 20 inches long and weighs 6-10 pounds. Body changes during your third trimester During the third trimester, your body will continue to go through many changes. The changes vary and generally return to normal after your baby is born. Physical changes Your weight will continue to increase. You can expect to gain 25-35 pounds (11-16 kg) by the end of the pregnancy if you begin pregnancy at a normal weight. If you are underweight, you can expect to gain 28-40 lb (about 13-18 kg), and if you are overweight, you can expect to gain 15-25 lb (about 7-11 kg). You may begin to get stretch marks on your hips, abdomen, and breasts. Your breasts will continue to grow and may hurt. A yellow fluid (colostrum) may leak from your breasts. This is the first milk you are producing for your baby. You may have changes in your hair. These can include thickening of your hair, rapid growth, and changes in texture. Some people also have hair loss during or after pregnancy, or hair that feels dry or thin. Your belly button may stick out. You may notice more swelling in your hands, face, or ankles. Health changes You may have heartburn. You may have constipation. You may develop hemorrhoids. You may develop swollen, bulging veins (varicose veins) in your legs. You may have increased body aches in the pelvis, back, or thighs. This is due to weight gain and increased hormones that are relaxing your joints. You may have increased tingling or numbness in your hands, arms, and legs. The skin on your abdomen may also feel numb. You may feel short of breath because of your expanding uterus. Other changes You may urinate more often because the fetus is moving lower into your pelvis  and pressing on your bladder. You may have more problems sleeping. This may be caused by the size of your abdomen, an increased need to urinate, and an increase in your body's metabolism. You may notice the fetus "dropping," or moving lower in your abdomen (lightening). You may have increased vaginal discharge. You may notice that you have pain around your pelvic bone as your uterus distends. Follow these instructions at home: Medicines Follow your health care provider's instructions regarding medicine use. Specific medicines may be either safe or unsafe to take during pregnancy. Do not take any medicines unless approved by your health care provider. Take a prenatal vitamin that contains at least 600 micrograms (mcg) of folic acid. Eating and drinking Eat a healthy diet that includes fresh fruits and vegetables, whole grains, good sources of protein such as meat, eggs, or tofu, and low-fat dairy products. Avoid raw meat and unpasteurized juice, milk, and cheese. These carry germs that can harm you and your baby. Eat 4 or 5 small meals rather than 3 large meals a day. You may need to take these actions to prevent or treat constipation: Drink enough fluid to keep your urine pale yellow. Eat foods that are high in fiber, such as beans, whole grains, and fresh fruits and vegetables. Limit foods that are high in fat and processed sugars, such as fried or sweet foods. Activity Exercise only as directed by your health care provider. Most people can continue their usual exercise routine during pregnancy. Try to  exercise for 30 minutes at least 5 days a week. Stop exercising if you experience contractions in the uterus. Stop exercising if you develop pain or cramping in the lower abdomen or lower back. Avoid heavy lifting. Do not exercise if it is very hot or humid or if you are at a high altitude. If you choose to, you may continue to have sex unless your health care provider tells you not  to. Relieving pain and discomfort Take frequent breaks and rest with your legs raised (elevated) if you have leg cramps or low back pain. Take warm sitz baths to soothe any pain or discomfort caused by hemorrhoids. Use hemorrhoid cream if your health care provider approves. Wear a supportive bra to prevent discomfort from breast tenderness. If you develop varicose veins: Wear support hose as told by your health care provider. Elevate your feet for 15 minutes, 3-4 times a day. Limit salt in your diet. Safety Talk to your health care provider before traveling far distances. Do not use hot tubs, steam rooms, or saunas. Wear your seat belt at all times when driving or riding in a car. Talk with your health care provider if someone is verbally or physically abusive to you. Preparing for birth To prepare for the arrival of your baby: Take prenatal classes to understand, practice, and ask questions about labor and delivery. Visit the hospital and tour the maternity area. Purchase a rear-facing car seat and make sure you know how to install it in your car. Prepare the baby's room or sleeping area. Make sure to remove all pillows and stuffed animals from the baby's crib to prevent suffocation. General instructions Avoid cat litter boxes and soil used by cats. These carry germs that can cause birth defects in the baby. If you have a cat, ask someone to clean the litter box for you. Do not douche or use tampons. Do not use scented sanitary pads. Do not use any products that contain nicotine or tobacco, such as cigarettes, e-cigarettes, and chewing tobacco. If you need help quitting, ask your health care provider. Do not use any herbal remedies, illegal drugs, or medicines that were not prescribed to you. Chemicals in these products can harm your baby. Do not drink alcohol. You will have more frequent prenatal exams during the third trimester. During a routine prenatal visit, your health care provider  will do a physical exam, perform tests, and discuss your overall health. Keep all follow-up visits. This is important. Where to find more information American Pregnancy Association: americanpregnancy.[place] and Gynecologists: PoolDevices.com.pt Office on [employer]: KeywordPortfolios.com.br Contact a health care provider if you have: A fever. Mild pelvic cramps, pelvic pressure, or nagging pain in your abdominal area or lower back. Vomiting or diarrhea. Bad-smelling vaginal discharge or foul-smelling urine. Pain when you urinate. A headache that does not go away when you take medicine. Visual changes or see spots in front of your eyes. Get help right away if: Your water breaks. You have regular contractions less than 5 minutes apart. You have spotting or bleeding from your vagina. You have severe abdominal pain. You have difficulty breathing. You have chest pain. You have fainting spells. You have not felt your baby move for the time period told by your health care provider. You have new or increased pain, swelling, or redness in an arm or leg. Summary The third trimester of pregnancy is from week 28 through week 40 (months 7 through 9). You may have more problems sleeping.  This can be caused by the size of your abdomen, an increased need to urinate, and an increase in your body's metabolism. You will have more frequent prenatal exams during the third trimester. Keep all follow-up visits. This is important. This information is not intended to replace advice given to you by your health care provider. Make sure you discuss any questions you have with your health care provider. Document Revised: 03/01/2020 Document Reviewed: 01/06/2020 Elsevier Patient Education  2022 [place]. Iron-Rich Diet Iron is a mineral that helps your body produce hemoglobin. Hemoglobin is a protein in red blood cells that carries oxygen to your body's  tissues. Eating too little iron may cause you to feel weak and tired, and it can increase your risk of infection. Iron is naturally found in many foods, and many foods have iron added to them (are iron-fortified). You may need to follow an iron-rich diet if you do not have enough iron in your body due to certain medical conditions. The amount of iron that you need each day depends on your age, your sex, and any medical conditions you have. Follow instructions from your health care provider or a dietitian about how much iron you should eat each day. What are tips for following this plan? Reading food labels Check food labels to see how many milligrams (mg) of iron are in each serving. Cooking Cook foods in pots and pans that are made from iron. Take these steps to make it easier for your body to absorb iron from certain foods: Soak beans overnight before cooking. Soak whole grains overnight and drain them before using. Ferment flours before baking, such as by using yeast in bread dough. Meal planning When you eat foods that contain iron, you should eat them with foods that are high in vitamin C. These include oranges, peppers, tomatoes, potatoes, and mangoes. Vitamin C helps your body absorb iron. Certain foods and drinks prevent your body from absorbing iron properly. Avoid eating these foods in the same meal as iron-rich foods or with iron supplements. These foods include: Coffee, black tea, and red wine. Milk, dairy products, and foods that are high in calcium. Beans and soybeans. Whole grains. General information Take iron supplements only as told by your health care provider. An overdose of iron can be life-threatening. If you were prescribed iron supplements, take them with orange juice or a vitamin C supplement. When you eat iron-fortified foods or take an iron supplement, you should also eat foods that naturally contain iron, such as meat, poultry, and fish. Eating naturally iron-rich  foods helps your body absorb the iron that is added to other foods or contained in a supplement. Iron from animal sources is better absorbed than iron from plant sources. What foods should I eat? Fruits Prunes. Raisins. Eat fruits high in vitamin C, such as oranges, grapefruits, and strawberries, with iron-rich foods. Vegetables Spinach (cooked). Green peas. Broccoli. Fermented vegetables. Eat vegetables high in vitamin C, such as leafy greens, potatoes, bell peppers, and tomatoes, with iron-rich foods. Grains Iron-fortified breakfast cereal. Iron-fortified whole-wheat bread. Enriched rice. Sprouted grains. Meats and other proteins Beef liver. Beef. [place]. Chicken. Oysters. Shrimp. [place]. Sardines. Chickpeas. Nuts. Tofu. Pumpkin seeds. Beverages Tomato juice. Fresh orange juice. Prune juice. Hibiscus tea. Iron-fortified instant breakfast shakes. Sweets and desserts Blackstrap molasses. Seasonings and condiments Tahini. Fermented soy sauce. Other foods Wheat germ. The items listed above may not be a complete list of recommended foods and beverages. Contact a dietitian for more information. What  foods should I limit? These are foods that should be limited while eating iron-rich foods as they can reduce the absorption of iron in your body. Grains Whole grains. Bran cereal. Bran flour. Meats and other proteins Soybeans. Products made from soy protein. Black beans. Lentils. Mung beans. Split peas. Dairy Milk. Cream. Cheese. Yogurt. Cottage cheese. Beverages Coffee. Black tea. Red wine. Sweets and desserts Cocoa. Chocolate. Ice cream. Seasonings and condiments Basil. Oregano. Large amounts of parsley. The items listed above may not be a complete list of foods and beverages you should limit. Contact a dietitian for more information. Summary Iron is a mineral that helps your body produce hemoglobin. Hemoglobin is a protein in red blood cells that carries oxygen to your body's  tissues. Iron is naturally found in many foods, and many foods have iron added to them (are iron-fortified). When you eat foods that contain iron, you should eat them with foods that are high in vitamin C. Vitamin C helps your body absorb iron. Certain foods and drinks prevent your body from absorbing iron properly, such as whole grains and dairy products. You should avoid eating these foods in the same meal as iron-rich foods or with iron supplements. This information is not intended to replace advice given to you by your health care provider. Make sure you discuss any questions you have with your health care provider. Document Revised: 09/04/2020 Document Reviewed: 09/04/2020 Elsevier Patient Education  2022 [place]. Fetal Movement Counts Patient Name: ________________________________________________ Patient Due Date: ____________________ What is a fetal movement count? A fetal movement count is the number of times that you feel your baby move during a certain amount of time. This may also be called a fetal kick count. A fetal movement count is recommended for every pregnant woman. You may be asked to start counting fetal movements as early as week 28 of your pregnancy. Pay attention to when your baby is most active. You may notice your baby's sleep and wake cycles. You may also notice things that make your baby move more. You should do a fetal movement count: When your baby is normally most active. At the same time each day. A good time to count movements is while you are resting, after having something to eat and drink. How do I count fetal movements? Find a quiet, comfortable area. Sit, or lie down on your side. Write down the date, the start time and stop time, and the number of movements that you felt between those two times. Take this information with you to your health care visits. Write down your start time when you feel the first movement. Count kicks, flutters, swishes, rolls,  and jabs. You should feel at least 10 movements. You may stop counting after you have felt 10 movements, or if you have been counting for 2 hours. Write down the stop time. If you do not feel 10 movements in 2 hours, contact your health care provider for further instructions. Your health care provider may want to do additional tests to assess your baby's well-being. Contact a health care provider if: You feel fewer than 10 movements in 2 hours. Your baby is not moving like he or she usually does. Date: ____________ Start time: ____________ Stop time: ____________ Movements: ____________ Date: ____________ Start time: ____________ Stop time: ____________ Movements: ____________ Date: ____________ Start time: ____________ Stop time: ____________ Movements: ____________ Date: ____________ Start time: ____________ Stop time: ____________ Movements: ____________ Date: ____________ Start time: ____________ Stop time: ____________ Movements: ____________ Date: ____________  Start time: ____________ Stop time: ____________ Movements: ____________ Date: ____________ Start time: ____________ Stop time: ____________ Movements: ____________ Date: ____________ Start time: ____________ Stop time: ____________ Movements: ____________ Date: ____________ Start time: ____________ Stop time: ____________ Movements: ____________ This information is not intended to replace advice given to you by your health care provider. Make sure you discuss any questions you have with your health care provider. Document Revised: 05/13/2019 Document Reviewed: 05/13/2019 Elsevier Patient Education  [place].

## 2021-07-19 ENCOUNTER — Other Ambulatory Visit: Payer: Self-pay | Admitting: Obstetrics and Gynecology

## 2021-07-19 DIAGNOSIS — D509 Iron deficiency anemia, unspecified: Secondary | ICD-10-CM

## 2021-07-19 LAB — GLUCOSE TOLERANCE, 2 HOURS W/ 1HR
Glucose, 1 hour: 66 mg/dL — ABNORMAL LOW (ref 70–179)
Glucose, 2 hour: 66 mg/dL — ABNORMAL LOW (ref 70–152)
Glucose, Fasting: 77 mg/dL (ref 70–91)

## 2021-07-19 LAB — CBC
Hematocrit: 29.8 % — ABNORMAL LOW (ref 34.0–46.6)
Hemoglobin: 10.1 g/dL — ABNORMAL LOW (ref 11.1–15.9)
MCH: 27.2 pg (ref 26.6–33.0)
MCHC: 33.9 g/dL (ref 31.5–35.7)
MCV: 80 fL (ref 79–97)
Platelets: 269 10*3/uL (ref 150–450)
RBC: 3.71 x10E6/uL — ABNORMAL LOW (ref 3.77–5.28)
RDW: 13.6 % (ref 11.7–15.4)
WBC: 13.4 10*3/uL — ABNORMAL HIGH (ref 3.4–10.8)

## 2021-07-19 LAB — RPR: RPR Ser Ql: NONREACTIVE

## 2021-07-19 LAB — HIV ANTIBODY (ROUTINE TESTING W REFLEX): HIV Screen 4th Generation wRfx: NONREACTIVE

## 2021-07-19 MED ORDER — ASCORBIC ACID 500 MG PO TABS: 500.0000 mg | ORAL_TABLET | ORAL | 3 refills | Status: DC

## 2021-07-19 MED ORDER — FERROUS SULFATE 325 (65 FE) MG PO TBEC: 325.0000 mg | DELAYED_RELEASE_TABLET | ORAL | 2 refills | Status: DC

## 2021-07-19 NOTE — Telephone Encounter (Signed)
My Chart message sent

## 2021-07-19 NOTE — Telephone Encounter (Signed)
Refill request received for Symbicort  Last seen for Symbicort 02/2020 Seen here 01/2021 for confirmation of pregnancy  Seen yesterday by [facility] for routine OB care  For symbicort, it has been more than a year since we saw her for that, so it cannot be refilled.  For asthma during pregnancy, please see your OB.  And, she is older than we usually continue to see patients.   She should find a clinic for adults after her pregnancy.   Please call to check to see it this was a request from the pharmacy or from [place] not approved.

## 2021-07-25 LAB — URINE CULTURE, OB REFLEX

## 2021-07-25 LAB — CULTURE, OB URINE

## 2021-07-31 ENCOUNTER — Telehealth: Payer: Self-pay

## 2021-07-31 DIAGNOSIS — J4541 Moderate persistent asthma with (acute) exacerbation: Secondary | ICD-10-CM

## 2021-07-31 DIAGNOSIS — J4540 Moderate persistent asthma, uncomplicated: Secondary | ICD-10-CM

## 2021-07-31 MED ORDER — ALBUTEROL SULFATE HFA 108 (90 BASE) MCG/ACT IN AERS
2.0000 | INHALATION_SPRAY | RESPIRATORY_TRACT | 0 refills | Status: DC | PRN
Start: 2021-07-31 — End: 2022-05-16

## 2021-07-31 MED ORDER — BUDESONIDE-FORMOTEROL FUMARATE 160-4.5 MCG/ACT IN AERO: 2.0000 | INHALATION_SPRAY | Freq: Two times a day (BID) | RESPIRATORY_TRACT | 3 refills | Status: DC

## 2021-07-31 NOTE — Telephone Encounter (Signed)
Pt called to request refill for inhalers, symbicort and proair. Per Dr. Roselie Awkward, ok to refill. Will send in to patient's pharmacy.

## 2021-08-09 ENCOUNTER — Ambulatory Visit: Payer: Medicaid Other | Attending: Obstetrics and Gynecology

## 2021-08-09 ENCOUNTER — Other Ambulatory Visit: Payer: Self-pay | Admitting: *Deleted

## 2021-08-09 ENCOUNTER — Other Ambulatory Visit: Payer: Self-pay

## 2021-08-09 ENCOUNTER — Ambulatory Visit: Payer: Medicaid Other | Admitting: *Deleted

## 2021-08-09 ENCOUNTER — Encounter: Payer: Self-pay | Admitting: *Deleted

## 2021-08-09 VITALS — BP 118/68 | HR 87

## 2021-08-09 DIAGNOSIS — E669 Obesity, unspecified: Secondary | ICD-10-CM

## 2021-08-09 DIAGNOSIS — O403XX0 Polyhydramnios, third trimester, not applicable or unspecified: Secondary | ICD-10-CM | POA: Insufficient documentation

## 2021-08-09 DIAGNOSIS — O99210 Obesity complicating pregnancy, unspecified trimester: Secondary | ICD-10-CM | POA: Insufficient documentation

## 2021-08-09 DIAGNOSIS — O283 Abnormal ultrasonic finding on antenatal screening of mother: Secondary | ICD-10-CM

## 2021-08-09 DIAGNOSIS — Z3A32 32 weeks gestation of pregnancy: Secondary | ICD-10-CM | POA: Diagnosis not present

## 2021-08-09 DIAGNOSIS — O99213 Obesity complicating pregnancy, third trimester: Secondary | ICD-10-CM | POA: Diagnosis not present

## 2021-08-09 DIAGNOSIS — Z6841 Body Mass Index (BMI) 40.0 and over, adult: Secondary | ICD-10-CM

## 2021-08-15 ENCOUNTER — Encounter: Payer: Self-pay | Admitting: Women's Health

## 2021-08-15 ENCOUNTER — Ambulatory Visit (INDEPENDENT_AMBULATORY_CARE_PROVIDER_SITE_OTHER): Payer: Medicaid Other | Admitting: Women's Health

## 2021-08-15 ENCOUNTER — Other Ambulatory Visit: Payer: Self-pay

## 2021-08-15 VITALS — BP 121/69 | HR 64 | Wt 256.4 lb

## 2021-08-15 DIAGNOSIS — O99019 Anemia complicating pregnancy, unspecified trimester: Secondary | ICD-10-CM

## 2021-08-15 DIAGNOSIS — O99210 Obesity complicating pregnancy, unspecified trimester: Secondary | ICD-10-CM

## 2021-08-15 DIAGNOSIS — J4520 Mild intermittent asthma, uncomplicated: Secondary | ICD-10-CM

## 2021-08-15 DIAGNOSIS — Z3A33 33 weeks gestation of pregnancy: Secondary | ICD-10-CM

## 2021-08-15 DIAGNOSIS — Z3480 Encounter for supervision of other normal pregnancy, unspecified trimester: Secondary | ICD-10-CM

## 2021-08-15 DIAGNOSIS — D573 Sickle-cell trait: Secondary | ICD-10-CM

## 2021-08-15 MED ORDER — BECLOMETHASONE DIPROP HFA 40 MCG/ACT IN AERB: 1.0000 | INHALATION_SPRAY | Freq: Two times a day (BID) | RESPIRATORY_TRACT | 2 refills | Status: DC

## 2021-08-15 NOTE — Progress Notes (Signed)
Pt presents for ROB without complaints today.

## 2021-08-15 NOTE — Progress Notes (Signed)
Subjective:  Phyllis Christian is a 20 y.o. G1P0 at [redacted]w[redacted]d being seen today for ongoing prenatal care.  She is currently monitored for the following issues for this high-risk pregnancy and has Sickle cell trait ([place]); Obstructive sleep apnea; Mild intermittent asthma without complication; Supervision of other normal pregnancy, antepartum; Maternal obesity affecting pregnancy, antepartum; and Echogenic intracardiac focus of fetus on prenatal ultrasound on their problem list.  Patient reports no complaints.  Contractions: Not present. Vag. Bleeding: None.  Movement: Present. Denies leaking of fluid.   The following portions of the patient's history were reviewed and updated as appropriate: allergies, current medications, past family history, past medical history, past social history, past surgical history and problem list. Problem list updated.  Objective:   Vitals:   08/15/21 1112  BP: 121/69  Pulse: 64  Weight: 256 lb 6.4 oz (116.3 kg)    Fetal Status: Fetal Heart Rate (bpm): 134   Movement: Present     General:  Alert, oriented and cooperative. Patient is in no acute distress.  Skin: Skin is warm and dry. No rash noted.   Cardiovascular: Normal heart rate noted  Respiratory: Normal respiratory effort, no problems with respiration noted  Abdomen: Soft, gravid, appropriate for gestational age. Pain/Pressure: Absent     Pelvic: Vag. Bleeding: None     Cervical exam deferred        Extremities: Normal range of motion.  Edema: None  Mental Status: Normal mood and affect. Normal behavior. Normal judgment and thought content.   Urinalysis:      Assessment and Plan:  Pregnancy: G1P0 at [redacted]w[redacted]d  1. Supervision of other normal pregnancy, antepartum  2. [redacted] weeks gestation of pregnancy  3. Sickle cell trait in mother affecting pregnancy ([place]) - Culture, OB Urine  4. Maternal obesity affecting pregnancy, antepartum  5. Mild intermittent asthma without complication -pt reports using rescue  inhaler 3 times per week since 27/28 weeks of pregnancy. Consulted with Dr. Roselie Awkward, will start on daily QVAR inhaler, sent to pharmacy. Check-in next visit on control and increase dose PRN.  6. Sickle cell trait ([place])  Preterm labor symptoms and general obstetric precautions including but not limited to vaginal bleeding, contractions, leaking of fluid and fetal movement were reviewed in detail with the patient. I discussed the assessment and treatment plan with the patient. The patient was provided an opportunity to ask questions and all were answered. The patient agreed with the plan and demonstrated an understanding of the instructions. The patient was advised to call back or seek an in-person office evaluation/go to [facility] at [facility] for any urgent or concerning symptoms. Please refer to After Visit Summary for other counseling recommendations.  Return in about 3 weeks (around 09/05/2021) for in-person LOB/APP OK.   Jaleel Allen, Gerrie Nordmann, NP

## 2021-08-15 NOTE — Patient Instructions (Signed)
Maternity Assessment Unit (MAU)  The Maternity Assessment Unit (MAU) is located at the [facility] and [place] at [facility] The address is: [address] [place], [place], [place] [address]. Please see map below for additional directions.    The Maternity Assessment Unit is designed to help you during your pregnancy, and for up to 6 weeks after delivery, with any pregnancy- or postpartum-related emergencies, if you think you are in labor, or if your water has broken. For example, if you experience nausea and vomiting, vaginal bleeding, severe abdominal or pelvic pain, elevated blood pressure or other problems related to your pregnancy or postpartum time, please come to the Maternity Assessment Unit for assistance.       Preterm Labor The normal length of a pregnancy is 39-41 weeks. Preterm labor is when labor starts before 37 completed weeks of pregnancy. Babies who are born prematurely and survive may not be fully developed and may be at an increased risk for long-term problems such as cerebral palsy, developmental delays, and vision and hearing problems. Babies who are born too early may have problems soon after birth. Premature babies may have problems regulating blood sugar, body temperature, heart rate, and breathing rate. These babies often have trouble with feeding. The risk of having problems is highest for babies who are born before 48 weeks of pregnancy. What are the causes? The exact cause of this condition is not known. What increases the risk? You are more likely to have preterm labor if you have certain risk factors that relate to your medical history, problems with present and past pregnancies, and lifestyle factors. Medical history You have abnormalities of the uterus, including a short cervix. You have STIs (sexually transmitted infections) or other infections of the urinary tract and the vagina. You have chronic illnesses, such as blood clotting  problems, diabetes, or high blood pressure. You are overweight or underweight. Present and past pregnancies You have had preterm labor before. You are pregnant with twins or other multiples. You have been diagnosed with a condition in which the placenta covers your cervix (placenta previa). You waited less than 18 months between giving birth and becoming pregnant again. Your unborn baby has some abnormalities. You have vaginal bleeding during pregnancy. You became pregnant through in vitro fertilization (IVF). Lifestyle and environmental factors You use tobacco products or drink alcohol. You use drugs. You have stress and no social support. You experience domestic violence. You are exposed to certain chemicals or environmental pollutants. Other factors You are younger than age 62 or older than age 53. What are the signs or symptoms? Symptoms of this condition include: Cramps similar to those that can happen during a menstrual period. The cramps may happen with diarrhea. Pain in the abdomen or lower back. Regular contractions that may feel like tightening of the abdomen. A feeling of increased pressure in the pelvis. Increased watery or bloody mucus discharge from the vagina. Water breaking (ruptured amniotic sac). How is this diagnosed? This condition is diagnosed based on: Your medical history and a physical exam. A pelvic exam. An ultrasound. Monitoring your uterus for contractions. Other tests, including: A swab of the cervix to check for a chemical called fetal fibronectin. Urine tests. How is this treated? Treatment for this condition depends on the length of your pregnancy, your condition, and the health of your baby. Treatment may include: Taking medicines, such as: Hormone medicines. These may be given early in pregnancy to help support the pregnancy. Medicines to stop  contractions. Medicines to help mature the baby's lungs. These may be prescribed if the risk of  delivery is high. Medicines to help protect your baby from brain and nerve complications such as cerebral palsy. Bed rest. If the labor happens before 34 weeks of pregnancy, you may need to stay in the hospital. Delivery of the baby. Follow these instructions at home:  Do not use any products that contain nicotine or tobacco. These products include cigarettes, chewing tobacco, and vaping devices, such as e-cigarettes. If you need help quitting, ask your health care provider. Do not drink alcohol. Take over-the-counter and prescription medicines only as told by your health care provider. Rest as told by your health care provider. Return to your normal activities as told by your health care provider. Ask your health care provider what activities are safe for you. Keep all follow-up visits. This is important. How is this prevented? To increase your chance of having a full-term pregnancy: Do not use drugs or take medicines that have not been prescribed to you during your pregnancy. Talk with your health care provider before taking any herbal supplements, even if you have been taking them regularly. Make sure you gain a healthy amount of weight during your pregnancy. Watch for infection. If you think that you might have an infection, get it checked right away. Symptoms of infection may include: Fever. Abnormal vaginal discharge or discharge that smells bad. Pain or burning with urination. Needing to urinate urgently. Frequently urinating or passing small amounts of urine frequently. Blood in your urine or urine that smells bad or unusual. Where to find more information U.S. Department of Health and Programmer, systems on Women's Health: [place] [employer]necologists: www.acog.org Centers for Disease Control and Prevention, Preterm Birth: http://www.wolf.info/ Contact a health care provider if: You think you are going into preterm labor. You have signs  or symptoms of preterm labor. You have symptoms of infection. Get help right away if: You are having regular, painful contractions every 5 minutes or less. Your water breaks. Summary Preterm labor is labor that starts before you reach 37 weeks of pregnancy. Delivering your baby early increases your baby's risk of developing long-term problems. You are more likely to have preterm labor if you have certain risk factors that relate to your medical history, problems with present and past pregnancies, and lifestyle factors. Keep all follow-up visits. This is important. Contact a health care provider if you have signs or symptoms of preterm labor. This information is not intended to replace advice given to you by your health care provider. Make sure you discuss any questions you have with your health care provider. Document Revised: 09/26/2020 Document Reviewed: 09/26/2020 Elsevier Patient Education  2022 [place].       Group B Streptococcus Test During Pregnancy Why am I having this test? Routine testing, also called screening, for group B streptococcus (GBS) is recommended for all pregnant women between the 36th and 37th week of pregnancy. GBS is a type of bacteria that can be passed from mother to baby during childbirth. Screening will help guide whether or not you will need treatment during labor and delivery to prevent complications such as: An infection in your uterus during labor. An infection in your uterus after delivery. A serious infection in your baby after delivery, such as pneumonia, meningitis, or sepsis. GBS screening is not often done before 36 weeks of pregnancy unless you go into labor prematurely. What happens if I have group  B streptococcus? If testing shows that you have GBS, your health care provider will recommend treatment with IV antibiotics during labor and delivery. This treatment significantly decreases the risk of complications for you and your baby. If you  have a planned C-section and you have GBS, you may not need to be treated with antibiotics because GBS is usually passed to babies after labor starts and your water breaks. If you are in labor or your water breaks before your C-section, it is possible for GBS to get into your uterus and be passed to your baby, so you might need treatment. Is there a chance I may not need to be tested? You may not need to be tested for GBS if: You have a urine test that shows GBS before 36 to 37 weeks. You had a baby with GBS infection after a previous delivery. In these cases, you will automatically be treated for GBS during labor and delivery. What is being tested? This test is done to check if you have group B streptococcus in your vagina or rectum. What kind of sample is taken? To collect samples for this test, your health care provider will swab your vagina and rectum with a cotton swab. The sample is then sent to the lab to see if GBS is present. What happens during the test?  You will remove your clothing from the waist down. You will lie down on an exam table in the same position as you would for a pelvic exam. Your health care provider will swab your vagina and rectum to collect samples for a culture test. You will be able to go home after the test and do all your usual activities. How are the results reported? The test results are reported as positive or negative. What do the results mean? A positive test means you are at risk for passing GBS to your baby during labor and delivery. Your health care provider will recommend that you are treated with an IV antibiotic during labor and delivery. A negative test means you are at very low risk of passing GBS to your baby. There is still a low risk of passing GBS to your baby because sometimes test results may report that you do not have a condition when you do (false-negative result) or there is a chance that you may become infected with GBS after the test is  done. You most likely will not need to be treated with an antibiotic during labor and delivery. Talk with your health care provider about what your results mean. Questions to ask your health care provider Ask your health care provider, or the department that is doing the test: When will my results be ready? How will I get my results? What are my treatment options? Summary Routine testing (screening) for group B streptococcus (GBS) is recommended for all pregnant women between the 36th and 37th week of pregnancy. GBS is a type of bacteria that can be passed from mother to baby during childbirth. If testing shows that you have GBS, your health care provider will recommend that you are treated with IV antibiotics during labor and delivery. This treatment almost always prevents infection in newborns. This information is not intended to replace advice given to you by your health care provider. Make sure you discuss any questions you have with your health care provider. Document Revised: 07/25/2020 Document Reviewed: 10/21/2018 Elsevier Patient Education  2022 [employer].

## 2021-08-17 LAB — URINE CULTURE, OB REFLEX

## 2021-08-17 LAB — CULTURE, OB URINE

## 2021-08-23 ENCOUNTER — Other Ambulatory Visit: Payer: Self-pay

## 2021-08-23 ENCOUNTER — Inpatient Hospital Stay (HOSPITAL_COMMUNITY): Payer: Medicaid Other

## 2021-08-23 ENCOUNTER — Encounter (HOSPITAL_COMMUNITY): Payer: Self-pay | Admitting: Obstetrics & Gynecology

## 2021-08-23 ENCOUNTER — Inpatient Hospital Stay (HOSPITAL_COMMUNITY)
Admission: AD | Admit: 2021-08-23 | Discharge: 2021-08-23 | Disposition: A | Discharge status: Home / Self Care | Payer: Medicaid Other | Attending: Obstetrics & Gynecology | Admitting: Obstetrics & Gynecology

## 2021-08-23 DIAGNOSIS — J101 Influenza due to other identified influenza virus with other respiratory manifestations: Secondary | ICD-10-CM

## 2021-08-23 DIAGNOSIS — R0602 Shortness of breath: Secondary | ICD-10-CM | POA: Diagnosis not present

## 2021-08-23 DIAGNOSIS — R079 Chest pain, unspecified: Secondary | ICD-10-CM

## 2021-08-23 DIAGNOSIS — O99513 Diseases of the respiratory system complicating pregnancy, third trimester: Secondary | ICD-10-CM | POA: Diagnosis not present

## 2021-08-23 DIAGNOSIS — R059 Cough, unspecified: Secondary | ICD-10-CM

## 2021-08-23 DIAGNOSIS — J129 Viral pneumonia, unspecified: Secondary | ICD-10-CM

## 2021-08-23 DIAGNOSIS — J1008 Influenza due to other identified influenza virus with other specified pneumonia: Secondary | ICD-10-CM | POA: Insufficient documentation

## 2021-08-23 DIAGNOSIS — Z20822 Contact with and (suspected) exposure to covid-19: Secondary | ICD-10-CM | POA: Insufficient documentation

## 2021-08-23 DIAGNOSIS — Z3A34 34 weeks gestation of pregnancy: Secondary | ICD-10-CM

## 2021-08-23 DIAGNOSIS — Z7722 Contact with and (suspected) exposure to environmental tobacco smoke (acute) (chronic): Secondary | ICD-10-CM | POA: Diagnosis not present

## 2021-08-23 DIAGNOSIS — J45901 Unspecified asthma with (acute) exacerbation: Secondary | ICD-10-CM | POA: Diagnosis not present

## 2021-08-23 DIAGNOSIS — O98513 Other viral diseases complicating pregnancy, third trimester: Secondary | ICD-10-CM | POA: Insufficient documentation

## 2021-08-23 DIAGNOSIS — J45909 Unspecified asthma, uncomplicated: Secondary | ICD-10-CM

## 2021-08-23 DIAGNOSIS — J9811 Atelectasis: Secondary | ICD-10-CM | POA: Diagnosis not present

## 2021-08-23 DIAGNOSIS — R918 Other nonspecific abnormal finding of lung field: Secondary | ICD-10-CM | POA: Diagnosis not present

## 2021-08-23 LAB — CBC WITH DIFFERENTIAL/PLATELET
Abs Immature Granulocytes: 0.11 10*3/uL — ABNORMAL HIGH (ref 0.00–0.07)
Basophils Absolute: 0 10*3/uL (ref 0.0–0.1)
Basophils Relative: 0 %
Eosinophils Absolute: 0 10*3/uL (ref 0.0–0.5)
Eosinophils Relative: 0 %
HCT: 33.6 % — ABNORMAL LOW (ref 36.0–46.0)
Hemoglobin: 10.9 g/dL — ABNORMAL LOW (ref 12.0–15.0)
Immature Granulocytes: 1 %
Lymphocytes Relative: 11 %
Lymphs Abs: 1 10*3/uL (ref 0.7–4.0)
MCH: 25.7 pg — ABNORMAL LOW (ref 26.0–34.0)
MCHC: 32.4 g/dL (ref 30.0–36.0)
MCV: 79.2 fL — ABNORMAL LOW (ref 80.0–100.0)
Monocytes Absolute: 1 10*3/uL (ref 0.1–1.0)
Monocytes Relative: 10 %
Neutro Abs: 7.2 10*3/uL (ref 1.7–7.7)
Neutrophils Relative %: 78 %
Platelets: 303 10*3/uL (ref 150–400)
RBC: 4.24 MIL/uL (ref 3.87–5.11)
RDW: 14.5 % (ref 11.5–15.5)
WBC: 9.4 10*3/uL (ref 4.0–10.5)
nRBC: 0 % (ref 0.0–0.2)

## 2021-08-23 LAB — COMPREHENSIVE METABOLIC PANEL
ALT: 17 U/L (ref 0–44)
AST: 15 U/L (ref 15–41)
Albumin: 2.9 g/dL — ABNORMAL LOW (ref 3.5–5.0)
Alkaline Phosphatase: 161 U/L — ABNORMAL HIGH (ref 38–126)
Anion gap: 10 (ref 5–15)
BUN: <5 mg/dL — ABNORMAL LOW (ref 6–20)
CO2: 19 mmol/L — ABNORMAL LOW (ref 22–32)
Calcium: 8.3 mg/dL — ABNORMAL LOW (ref 8.9–10.3)
Chloride: 104 mmol/L (ref 98–111)
Creatinine, Ser: 0.85 mg/dL (ref 0.44–1.00)
GFR, Estimated: >60 mL/min (ref >60)
Glucose, Bld: 85 mg/dL (ref 70–99)
Potassium: 3.6 mmol/L (ref 3.5–5.1)
Sodium: 133 mmol/L — ABNORMAL LOW (ref 135–145)
Total Bilirubin: 0.6 mg/dL (ref 0.3–1.2)
Total Protein: 7 g/dL (ref 6.5–8.1)

## 2021-08-23 LAB — URINALYSIS, ROUTINE W REFLEX MICROSCOPIC
Bilirubin Urine: NEGATIVE
Glucose, UA: NEGATIVE mg/dL
Hgb urine dipstick: NEGATIVE
Ketones, ur: NEGATIVE mg/dL
Nitrite: NEGATIVE
Protein, ur: 30 mg/dL — AB
Specific Gravity, Urine: 1.016 (ref 1.005–1.030)
pH: 5 (ref 5.0–8.0)

## 2021-08-23 LAB — RESP PANEL BY RT-PCR (FLU A&B, COVID) ARPGX2
Influenza A by PCR: POSITIVE — AB
Influenza B by PCR: NEGATIVE
SARS Coronavirus 2 by RT PCR: NEGATIVE

## 2021-08-23 MED ORDER — PREDNISONE 50 MG PO TABS
60.0000 mg | ORAL_TABLET | Freq: Once | ORAL | Status: AC
  Administered 2021-08-23: 19:00:00 60 mg via ORAL
  Filled 2021-08-23: qty 1

## 2021-08-23 MED ORDER — BENZONATATE 100 MG PO CAPS: 200.0000 mg | ORAL_CAPSULE | Freq: Three times a day (TID) | ORAL | 0 refills | Status: DC | PRN

## 2021-08-23 MED ORDER — ALBUTEROL SULFATE (2.5 MG/3ML) 0.083% IN NEBU: 2.5000 mg | INHALATION_SOLUTION | RESPIRATORY_TRACT | 2 refills | Status: DC | PRN

## 2021-08-23 MED ORDER — IPRATROPIUM-ALBUTEROL 0.5-2.5 (3) MG/3ML IN SOLN
3.0000 mL | Freq: Once | RESPIRATORY_TRACT | Status: AC
  Administered 2021-08-23: 16:00:00 3 mL via RESPIRATORY_TRACT
  Filled 2021-08-23: qty 3

## 2021-08-23 MED ORDER — LACTATED RINGERS IV BOLUS
1000.0000 mL | Freq: Once | INTRAVENOUS | Status: AC
  Administered 2021-08-23: 16:00:00 1000 mL via INTRAVENOUS

## 2021-08-23 MED ORDER — ACETAMINOPHEN 500 MG PO TABS
1000.0000 mg | ORAL_TABLET | Freq: Once | ORAL | Status: AC
  Administered 2021-08-23: 15:00:00 1000 mg via ORAL
  Filled 2021-08-23: qty 2

## 2021-08-23 MED ORDER — PREDNISONE 20 MG PO TABS: 60.0000 mg | ORAL_TABLET | Freq: Every day | ORAL | 0 refills | Status: AC

## 2021-08-23 NOTE — MAU Provider Note (Addendum)
History     CSN: [id number]  Arrival date and time: 08/23/21 1338   Event Date/Time   First Provider Initiated Contact with Patient 08/23/21 1421      Chief Complaint  Patient presents with   Chest Pain   Shortness of Breath   Phyllis Christian is a 20 year old G1P0 at 9w5dwho presents to the MAU with a four day history of cough and shortness of breath. She went into work on Sunday night and noticed she had a "scratchy throat." Monday when she awoke, she had a cough and shortness of breath. She has multiple family members who have been sick recently including her younger brother and mother. She states they were each sick for a few days and then got better, but her symptoms have persisted. In addition to her cough and shortness of breath, she endorses headache, muscle aches, muscle weakness, and subjective fever and chills. She endorses pain in her chest when she coughs but no chest pain otherwise. She does not endorse any vision changes or lymphadenopathy.  Her asthma medication had been changed recently and she states she felt this brought her asthma under better control before the onset of these symptoms. She states this does not feel like an asthma exacerbation but instead states she "feels sick."  Chest Pain  Associated symptoms include a cough, a fever (subjective), headaches, shortness of breath and weakness.  Shortness of Breath Associated symptoms include chest pain (only when coughing), a fever (subjective) and headaches. Pertinent negatives include no wheezing.   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    Past Surgical History:   Procedure Laterality Date   NO PAST SURGERIES      Family History  Problem Relation Age of Onset   Diabetes Maternal Grandfather    Obesity Mother    Diabetes Mother    Asthma Father     Social History   Tobacco Use   Smoking status: Passive Smoke Exposure - Never Smoker   Smokeless tobacco: Never   Tobacco comments:    outside smoker  Vaping Use   Vaping Use: Never used  Substance Use Topics   Alcohol use: No   Drug use: No    Allergies:  Allergies  Allergen Reactions   Codeine Hives   Motrin [Ibuprofen] Hives    Medications Prior to Admission  Medication Sig Dispense Refill Last Dose   albuterol (VENTOLIN HFA) 108 (90 Base) MCG/ACT inhaler Inhale 2 puffs into the lungs every 4 (four) hours as needed for wheezing (or cough). Shake well; always use spacer. 18 g 0 08/23/2021 at 1300   budesonide-formoterol (SYMBICORT) 160-4.5 MCG/ACT inhaler Inhale 2 puffs into the lungs 2 (two) times daily.   08/22/2021   acetaminophen (TYLENOL) 500 MG tablet Take 1,000 mg by  mouth every 6 (six) hours as needed. (Patient not taking: No sig reported)      ascorbic acid (VITAMIN C) 500 MG tablet Take 1 tablet (500 mg total) by mouth every other day. Take with iron pill 30 tablet 3    aspirin EC 81 MG tablet Take 1 tablet (81 mg total) by mouth daily. Take after 12 weeks for prevention of preeclampsia later in pregnancy (Patient not taking: No sig reported) 300 tablet 2    beclomethasone (QVAR) 40 MCG/ACT inhaler Inhale 1 puff into the lungs 2 (two) times daily. 1 each 2    Blood Pressure Monitoring (BLOOD PRESSURE KIT) DEVI 1 Device by Does not apply route as needed. (Patient not taking: Reported on 08/15/2021) 1 each 0    ferrous sulfate 325 (65 FE) MG EC tablet Take 1 tablet (325 mg total) by mouth every other day. (Patient not taking: Reported on 08/15/2021) 30 tablet 2    Prenatal Vit-Fe Fumarate-FA (PRENATAL VITAMIN) 27-0.8 MG TABS Take 1 tablet by mouth daily. 30 tablet 10     Review  of Systems  Constitutional:  Positive for chills, fatigue and fever (subjective).  Respiratory:  Positive for cough and shortness of breath. Negative for wheezing.   Cardiovascular:  Positive for chest pain (only when coughing).  Musculoskeletal:  Positive for myalgias. Negative for arthralgias.  Neurological:  Positive for weakness, light-headedness and headaches.  Hematological:  Negative for adenopathy.  Physical Exam   Blood pressure 122/73, pulse (!) 109, resp. rate (!) 23, last menstrual period 12/03/2020, SpO2 96 %.  Physical Exam Constitutional:      General: She is not in acute distress. HENT:     Head: Normocephalic and atraumatic.  Eyes:     Extraocular Movements: Extraocular movements intact.  Cardiovascular:     Rate and Rhythm: Regular rhythm. Tachycardia present.     Heart sounds: Normal heart sounds.  Pulmonary:     Effort: Tachypnea present.     Breath sounds: No stridor. Examination of the right-upper field reveals wheezing. Examination of the left-upper field reveals wheezing. Examination of the right-middle field reveals wheezing. Examination of the left-middle field reveals wheezing. Examination of the right-lower field reveals wheezing. Examination of the left-lower field reveals wheezing. Wheezing (bilateral inspiratory and expiratory wheezing in the lower lobes. expiratory wheezing bilaterally in the middle and upper lobes) present. No rhonchi or rales.  Musculoskeletal:     Cervical back: Normal range of motion.  Skin:    General: Skin is warm and dry.  Neurological:     Mental Status: She is alert and oriented to person, place, and time.    MAU Course  Procedures  MDM Patient presented to the MAU with signs and symptoms concerning for infectious respiratory process. Respiratory panel ordered to test for influenza and COVID. Wheezing present on exam and patient was given inhaled steroids and bronchodilators. CXR ordered for possible  pneumonia.  Assessment and Plan  Phyllis Christian is a 20 year old G1P0 at 41w5dwho presents to the MAU with a four day history of cough and shortness of breath. Patient tachycardic and tachypnic  on presentation but saturating well on room air. Patient improved clinically after steroids and bronchodilators.  Asthma Exacerbation 2/2 Influenza A Infection Positive for Influenza A by PCR. CXR shows bibasilar atelectasis. Wheezing resolved after steroids and bronchodilators. Patient has remained afebrile since presentation without an elevated WBC. Plan to treat asthma exacerbation as influenza will likely self resolve. -60 mg prednisone x 5  days -Stable for discharge  Phyllis Christian 08/23/2021, 2:36 PM    Medical student attestation:  I have seen and examined this patient and agree with above documentation in the medical student note.   Phyllis Christian is a 20 y.o. G1P0 female reporting cough, shortness of breath with cough, and sore throat. Symptoms started 4 days ago.She reports multiple family members in the house have been sick with the same symptoms. She does have a history of asthma. She used her albuterol inhaler 2x today. She has a QVAR inhaler however has not used it today. She feels her symptoms have persisted, however today she is eating and drinking more. Yesterday she had decreased appetite. The cough comes and goes. Nothing makes the cough better. The albuterol helps the shortness of breath.   Associated symptoms:  Negative fever and chills Negative abdominal pain Negative vaginal bleeding Negative vaginal discharge Negative urinary complaints Negative GI complaints  PE: Patient Vitals for the past 24 hrs:  BP Temp Temp src Pulse Resp SpO2  08/23/21 1446 -- -- -- -- -- 97 %  08/23/21 1440 -- -- -- -- -- 97 %  08/23/21 1435 -- -- -- -- -- 96 %  08/23/21 1430 -- -- -- -- -- 96 %  08/23/21 1425 -- -- -- -- -- 97 %  08/23/21 1420 -- -- -- -- -- 96 %  08/23/21 1418 122/73 --  -- (!) 109 -- 96 %  08/23/21 1406 -- -- -- -- -- 96 %  08/23/21 1405 128/80 98.3 F (36.8 C) Axillary (!) 121 (!) 23 97 %   Gen: calm comfortable, NAD Resp: normal effort, no distress, + wheezes, bilaterally  Heart: Regular rate Abd: Soft, NT, Pos BS x 4 Neuro: A&O x 4 Pelvic exam: Deferred   ROS, labs, PMH reviewed  Orders Placed This Encounter  Procedures   For home use only DME Nebulizer machine   Resp Panel by RT-PCR (Flu A&B, Covid) Nasopharyngeal Swab   DG CHEST PORT 1 VIEW   DG Chest 2 View   Urinalysis, Routine w reflex microscopic Urine, Clean Catch   CBC with Differential/Platelet   Comprehensive metabolic panel   Airborne / Contact ([place]) Isolation   Discharge patient   Meds ordered this encounter  Medications   acetaminophen (TYLENOL) tablet 1,000 mg   ipratropium-albuterol (DUONEB) 0.5-2.5 (3) MG/3ML nebulizer solution 3 mL   lactated ringers bolus 1,000 mL   predniSONE (DELTASONE) tablet 60 mg   albuterol (PROVENTIL) (2.5 MG/3ML) 0.083% nebulizer solution    Sig: Take 3 mLs (2.5 mg total) by nebulization every 4 (four) hours as needed for wheezing or shortness of breath.    Dispense:  75 mL    Refill:  2   benzonatate (TESSALON PERLES) 100 MG capsule    Sig: Take 2 capsules (200 mg total) by mouth 3 (three) times daily as needed for cough.    Dispense:  20 capsule    Refill:  0    Order Specific Question:   Supervising Provider    Answer:   Woodroe Mode [[id number]]   predniSONE (DELTASONE) 20 MG tablet    Sig: Take 3 tablets (60 mg total) by mouth daily for 4 days.    Dispense:  12 tablet    Refill:  0    Order Specific Question:   Supervising Provider    Answer:   Rennis Harding   DG Chest 2 View  Result Date: 08/23/2021 CLINICAL DATA:  Asthma, chest  pain, shortness of breath EXAM: CHEST - 2 VIEW COMPARISON:  08/23/2021 FINDINGS: Bibasilar atelectasis. Heart is normal size. No effusions or pneumothorax. No acute bony abnormality. IMPRESSION:  Bibasilar atelectasis. Electronically Signed   By: Rolm Baptise M.D.   On: 08/23/2021 17:40   DG CHEST PORT 1 VIEW  Result Date: 08/23/2021 EXAM: PORTABLE CHEST 1 VIEW COMPARISON:  10/30/2017. FINDINGS: New left greater than right bibasilar opacities. Small left pleural effusion not excluded. No visible pneumothorax. No acute osseous abnormality. Mild enlargement of the cardiac silhouette, likely accentuated by AP technique. IMPRESSION: New left greater than right bibasilar opacities, concerning for pneumonia. Small left pleural effusion not excluded. Electronically Signed   By: Margaretha Sheffield M.D.   On: 08/23/2021 14:59     MDM  Chest xray shows ?pneumonia, given that she is afebrile without leukocytosis this is likely viral and not bacterial.  Consulted with Dr. Pearletha Forge who comes to MAU to assess patient.  Influenza A positive Duo-Neb ordered and given LR bolus & Tylenol 1 gram given.  EKG shows normal sinus Tach: reviewed with Dr. Renard Matter  Patient is eating and drinking well in MAU.   Assessment 1. Viral pneumonia   2. Chest pain   3. Influenza A   4. Asthma   5. [redacted] weeks gestation of pregnancy   6. Cough, unspecified type     Plan: - Discharge home in stable condition. - Return precautions. - Follow-up as scheduled at your doctor's office or sooner as needed if symptoms worsen. - Return to maternity admissions symptoms worsen - Rx: Tessalon Perles, Prednisone, Nebulizer  - REST - Increase fluid intake   Gali Spinney, Artist Pais, NP 08/23/2021 7:10 PM

## 2021-08-23 NOTE — MAU Note (Signed)
Radiology at bedside for Chest X-Ray.

## 2021-08-23 NOTE — MAU Note (Signed)
NICU Respiratory Therapist called. On way to the unit.

## 2021-08-23 NOTE — MAU Note (Signed)
Notified pharmacy that only one 10 mg pill of Prednisone was sent to our tubing station. Pharmacist requested RN to send back the one pill and pharmacist will send back complete dose.

## 2021-08-23 NOTE — MAU Note (Signed)
NICU Respiratory Therapist called. RT stated she was in the middle of a code and would see the patient next once finished.

## 2021-08-23 NOTE — MAU Note (Signed)
Attempt to call RT for patients nebulizer treatment. No answer.

## 2021-08-23 NOTE — Consult Note (Signed)
[facility] Medical Consultation  Phyllis Christian UKG:[id number] DOB: 10/18/2000 DOA: 08/23/2021 PCP: Roselind Messier, MD   Requesting physician: Dr. Rolanda Lundborg Date of consultation: 08/23/2021 Reason for consultation: Influenza A  Impression/Recommendations Active Problems:   * No active hospital problems. *    Influenza A pneumonia, and the onset of symptoms was 4 days ago, and clinically patient reports her breathing symptoms continue to improve over the last 2 days, no fever, no hypoxia, recommend no Tamiflu treatment.  Medically stable to be discharged home.  In case of worsening of symptoms including worsening of wheezing or high fever, come back to ED. D/W patient and her sister at bedside. Acute asthma exacerbation, improving, no significant wheezing, agreed with albuterol, Pulmicort and Advair. Given there is no wheezing, recommend no systemic steroids. 34 weeks pregnancy, as per primary team.  I will followup again tomorrow. Please contact me if I can be of assistance in the meanwhile. Thank you for this consultation.  Chief Complaint: Feeling better  HPI:  20 year old female patient with past medical history of mild intermittent asthma, 34-week pregnancy presented with dry cough, wheezing, shortness of breath for 4 days.  Patient started developing symptoms this Monday, along with other 3 family members, her symptoms including dry cough, wheezing, shortness of breath, she has been using albuterol 3-4 times a day and Pulmicort and her symptoms slowly improved over the last 2 days.  No fever or chills.  She also reported systemic symptoms such as muscle aching, headache, pleuritic chest pain with coughing.  Medical team was consulted for evaluation of influenza A pneumonia.  Initial chest x-ray was [place] suspicious for bilateral interstitial pneumonia however repeated to review of x-ray shows bilateral atelectasis but no significant infiltrates.  Review of Systems:  14 point  review system negative except as also mentioned in HPI.  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 She has never used smokeless tobacco. She reports that she does not drink alcohol and does not use drugs.  Allergies  Allergen Reactions   Codeine Hives   Motrin [Ibuprofen] Hives   Family History  Problem Relation Age of Onset   Diabetes Maternal Grandfather    Obesity Mother    Diabetes Mother    Asthma Father     Prior to Admission medications   Medication Sig Start Date End Date Taking? Authorizing Provider  albuterol (PROVENTIL) (2.5 MG/3ML) 0.083% nebulizer solution Take 3 mLs (2.5 mg total) by nebulization every 4 (four) hours as needed for wheezing or shortness of breath. 08/23/21 08/23/22 Yes Mechille Varghese, Ralene Cork, MD  albuterol (VENTOLIN HFA) 108 (90 Base) MCG/ACT inhaler Inhale 2 puffs into the lungs every 4 (four) hours as needed for wheezing (or cough). Shake well; always use spacer. 07/31/21 08/30/21 Yes Woodroe Mode, MD  budesonide-formoterol [facility]) 160-4.5 MCG/ACT inhaler Inhale 2 puffs into the lungs 2 (two) times daily.   Yes [provider]  acetaminophen (TYLENOL) 500 MG tablet Take 1,000 mg by mouth every 6 (six) hours as needed. Patient not taking: No sig reported    [provider]  ascorbic acid (VITAMIN C) 500 MG tablet Take 1 tablet (500 mg total) by mouth every other day. Take with iron pill 07/19/21   Laury Deep, CNM  aspirin EC 81 MG tablet Take 1 tablet (81 mg total) by mouth daily. Take after 12 weeks for prevention of preeclampsia later in pregnancy Patient not taking: No sig reported 04/17/21   Constant,  Peggy, MD  beclomethasone (QVAR) 40 MCG/ACT inhaler Inhale 1 puff into the lungs 2 (two) times daily. 08/15/21   Nugent, Gerrie Nordmann, NP  Blood Pressure Monitoring (BLOOD PRESSURE KIT) DEVI 1 Device by Does not apply route as needed. Patient not taking: Reported on 08/15/2021 02/27/21   Constant, Peggy, MD  ferrous sulfate 325 (65 FE) MG EC tablet Take 1 tablet (325 mg total) by mouth every other day. Patient not taking: Reported on 08/15/2021 07/19/21   Laury Deep, CNM  Prenatal Vit-Fe Fumarate-FA (PRENATAL VITAMIN) 27-0.8 MG TABS Take 1 tablet by mouth daily. 02/08/21   Gladys Damme, MD   Physical Exam: Blood pressure 122/73, pulse (!) 109, temperature 98.3 F (36.8 C), temperature source Axillary, resp. rate (!) 23, last menstrual period 12/03/2020, SpO2 97 %. Vitals:   08/23/21 1440 08/23/21 1446  BP:    Pulse:    Resp:    Temp:    SpO2: 97% 97%    General: No acute distress Eyes: PERRL ENT: Mucous membranes moist Neck: Supple no JVD Cardiovascular: RRR, no murmurs Respiratory: Clear breathing bilaterally, no crackles or wheezing Abdomen: Compatible with pregnancy Skin: No rash no ulcers Musculoskeletal: Normal range of motion Psychiatric: Calm Neurologic: No neurodeficit  Labs on Admission:  Basic Metabolic Panel: Recent Labs  Lab 08/23/21 1438  NA 133*  K 3.6  CL 104  CO2 19*  GLUCOSE 85  BUN <5*  CREATININE 0.85  CALCIUM 8.3*   Liver Function Tests: Recent Labs  Lab 08/23/21 1438  AST 15  ALT 17  ALKPHOS 161*  BILITOT 0.6  PROT 7.0  ALBUMIN 2.9*   No results for input(s): LIPASE, AMYLASE in the last 168 hours. No results for input(s): AMMONIA in the last 168 hours. CBC: Recent Labs  Lab 08/23/21 1438  WBC 9.4  NEUTROABS 7.2  HGB 10.9*  HCT 33.6*  MCV 79.2*  PLT 303   Cardiac Enzymes: No results for input(s): CKTOTAL, CKMB, CKMBINDEX, TROPONINI in the last 168 hours. BNP: Invalid input(s): POCBNP CBG: No results for input(s): GLUCAP in  the last 168 hours.  Radiological Exams on Admission: DG Chest 2 View  Result Date: 08/23/2021 CLINICAL DATA:  Asthma, chest pain, shortness of breath EXAM: CHEST - 2 VIEW COMPARISON:  08/23/2021 FINDINGS: Bibasilar atelectasis. Heart is normal size. No effusions or pneumothorax. No acute bony abnormality. IMPRESSION: Bibasilar atelectasis. Electronically Signed   By: Rolm Baptise M.D.   On: 08/23/2021 17:40   DG CHEST PORT 1 VIEW  Result Date: 08/23/2021 EXAM: PORTABLE CHEST 1 VIEW COMPARISON:  10/30/2017. FINDINGS: New left greater than right bibasilar opacities. Small left pleural effusion not excluded. No visible pneumothorax. No acute osseous abnormality. Mild enlargement of the cardiac silhouette, likely accentuated by AP technique. IMPRESSION: New left greater than right bibasilar opacities, concerning for pneumonia. Small left pleural effusion not excluded. Electronically Signed   By: Margaretha Sheffield M.D.   On: 08/23/2021 14:59    EKG: None  Time spent: [facility] Pager [phone] 08/23/2021, 6:39 PM

## 2021-08-23 NOTE — MAU Note (Signed)
2nd attempt to call RT for patient's nebulizer treatment. Unsuccessful.

## 2021-08-23 NOTE — MAU Note (Addendum)
...  Phyllis Christian is a 20 y.o. at [redacted]w[redacted]d here in MAU reporting: Chest pain when coughing, SOB with activity, and rhinorrhea since Monday. She states she had a "scratchy" throat Sunday night when getting off of work and states she knew she was getting sick. +FM. No VB or LOF.  Has asthma.  O2 97% at rest on room air. 113 HR. RR 23.  Has not taken anything other than Symbicort and her albuterol every four hours since Monday. She states it calms her breathing a bit but does not help when she is active or walking at work.   Has not been anywhere to be tested.   Patients kindergarten brother was sick over the weekend with a cold. Patient states he was never tested for anything but received cough medicine.  FHT: 135 initial external Lab orders placed from triage: UA

## 2021-08-27 ENCOUNTER — Telehealth (INDEPENDENT_AMBULATORY_CARE_PROVIDER_SITE_OTHER): Payer: Medicaid Other | Admitting: Advanced Practice Midwife

## 2021-08-27 ENCOUNTER — Other Ambulatory Visit: Payer: Self-pay

## 2021-08-27 DIAGNOSIS — Z3480 Encounter for supervision of other normal pregnancy, unspecified trimester: Secondary | ICD-10-CM

## 2021-08-27 DIAGNOSIS — J4520 Mild intermittent asthma, uncomplicated: Secondary | ICD-10-CM

## 2021-08-27 DIAGNOSIS — J189 Pneumonia, unspecified organism: Secondary | ICD-10-CM

## 2021-08-27 DIAGNOSIS — O99513 Diseases of the respiratory system complicating pregnancy, third trimester: Secondary | ICD-10-CM

## 2021-08-27 DIAGNOSIS — J111 Influenza due to unidentified influenza virus with other respiratory manifestations: Secondary | ICD-10-CM

## 2021-08-27 DIAGNOSIS — Z3A35 35 weeks gestation of pregnancy: Secondary | ICD-10-CM

## 2021-08-27 NOTE — Progress Notes (Signed)
OBSTETRICS PRENATAL VIRTUAL VISIT ENCOUNTER NOTE  Provider location: Center for [address] at [facility]   Patient location: Home  I connected with [facility] on 08/27/21 at 11:15 AM EST by MyChart Video Encounter and verified that I am speaking with the correct person using two identifiers. I discussed the limitations, risks, security and privacy concerns of performing an evaluation and management service virtually and the availability of in person appointments. I also discussed with the patient that there may be a patient responsible charge related to this service. The patient expressed understanding and agreed to proceed. Subjective:  Phyllis Christian is a 20 y.o. G1P0 at [redacted]w[redacted]d being seen today for ongoing prenatal care.  She is currently monitored for the following issues for this low-risk pregnancy and has Sickle cell trait ([place]); Obstructive sleep apnea; Mild intermittent asthma without complication; Supervision of other normal pregnancy, antepartum; Maternal obesity affecting pregnancy, antepartum; and Echogenic intracardiac focus of fetus on prenatal ultrasound on their problem list.  Patient reports  resolving respiratory symptoms with influenza .  Contractions: Not present. Vag. Bleeding: None.  Movement: Present. Denies any leaking of fluid.   The following portions of the patient's history were reviewed and updated as appropriate: allergies, current medications, past family history, past medical history, past social history, past surgical history and problem list.   Objective:  There were no vitals filed for this visit.  Fetal Status:     Movement: Present     General:  Alert, oriented and cooperative. Patient is in no acute distress.  Respiratory: Normal respiratory effort, no problems with respiration noted  Mental Status: Normal mood and affect. Normal behavior. Normal judgment and thought content.  Rest of physical exam deferred due to type of encounter  Imaging: DG Chest  2 View  Result Date: 08/23/2021 CLINICAL DATA:  Asthma, chest pain, shortness of breath EXAM: CHEST - 2 VIEW COMPARISON:  08/23/2021 FINDINGS: Bibasilar atelectasis. Heart is normal size. No effusions or pneumothorax. No acute bony abnormality. IMPRESSION: Bibasilar atelectasis. Electronically Signed   By: Rolm Baptise M.D.   On: 08/23/2021 17:40   DG CHEST PORT 1 VIEW  Result Date: 08/23/2021 EXAM: PORTABLE CHEST 1 VIEW COMPARISON:  10/30/2017. FINDINGS: New left greater than right bibasilar opacities. Small left pleural effusion not excluded. No visible pneumothorax. No acute osseous abnormality. Mild enlargement of the cardiac silhouette, likely accentuated by AP technique. IMPRESSION: New left greater than right bibasilar opacities, concerning for pneumonia. Small left pleural effusion not excluded. Electronically Signed   By: Margaretha Sheffield M.D.   On: 08/23/2021 14:59   [name] MFM OB FOLLOW UP  Result Date: 08/09/2021 ----------------------------------------------------------------------  OBSTETRICS REPORT                       (Signed Final 08/09/2021 11:53 am) ---------------------------------------------------------------------- Patient Info  ID #:       [id number]                          D.O.B.:  11-05-2000 (20 yrs)  Name:       Phyllis Christian                     Visit Date: 08/09/2021 11:01 am ---------------------------------------------------------------------- Performed By  Attending:        Sander Nephew      Ref. Address:     [address]  [place]                                                             Rd                                                             [place]  Performed By:     Phyllis Christian            [facility]                                                             [place] [facility]                                                             [facility]   Referred By:      Phyllis Christian [place] ---------------------------------------------------------------------- Orders  #  Description                           Code        Ordered By  1  [name] MFM OB FOLLOW UP                   [phone]    [place] ----------------------------------------------------------------------  #  Order #                     Accession #                Episode #  1  [id number]                   [id number]                 [id number] ---------------------------------------------------------------------- Indications  [redacted] weeks gestation of pregnancy                Z3A.32  Neg AFP, Insufficient DNA NIPS  Obesity complicating pregnancy, third          O99.213  trimester BMI 40  Polyhydramnios, third trimester, antepartum    O40.3XX1  condition or complication, fetus 1  Genetic carrier (Sickle Cell Trait)  Z14.8 ---------------------------------------------------------------------- Fetal Evaluation  Num Of Fetuses:         1  Fetal Heart Rate(bpm):  123  Cardiac Activity:       Observed  Presentation:           Cephalic  Placenta:               Fundal  P. Cord Insertion:      Visualized  Amniotic Fluid  AFI FV:      Polyhydramnios  AFI Sum(cm)     %Tile       Largest Pocket(cm)  27.53           > 97        8.71  RUQ(cm)       RLQ(cm)       LUQ(cm)        LLQ(cm)  8.54          5.13          8.71           5.15 ---------------------------------------------------------------------- Biometry  BPD:      84.2  mm     G. Age:  33w 6d         77  %    CI:        70.24   %    70 - 86                                                          FL/HC:      18.2   %    19.9 - 21.5  HC:      320.4  mm     G. Age:  36w 1d         92  %    HC/AC:      1.16        0.96 - 1.11  AC:      275.7  mm     G. Age:  31w 4d         20  %    FL/BPD:     69.2   %    71 - 87  FL:       58.3  mm     G. Age:  30w 3d        2.4  %    FL/AC:      21.1   %    20 - 24  LV:        4.2  mm  Est. FW:     1859  gm      4 lb 2 oz     18  % ---------------------------------------------------------------------- OB History  Gravidity:    1  Living:       0 ---------------------------------------------------------------------- Gestational Age  LMP:           35w 4d        Date:  12/03/20                 EDD:   09/09/21  U/S Today:     33w 0d  EDD:   09/27/21  Best:          32w 5d     Det. ByLoman Chroman         EDD:   09/29/21                                      (02/21/21) ---------------------------------------------------------------------- Anatomy  Cranium:               Appears normal         Aortic Arch:            Previously seen  Cavum:                 Previously seen        Ductal Arch:            Previously seen  Ventricles:            Appears normal         Diaphragm:              Appears normal  Choroid Plexus:        Previously seen        Stomach:                Appears normal, left                                                                        sided  Cerebellum:            Previously seen        Abdomen:                Previously seen  Posterior Fossa:       Previously seen        Abdominal Wall:         Previously seen  Nuchal Fold:           Not applicable (>28    Cord Vessels:           Previously seen                         wks GA)  Face:                  Orbits and profile     Kidneys:                Appear normal                         previously seen  Lips:                  Previously seen        Bladder:                Appears normal  Thoracic:              Previously seen        Spine:                  Previously seen  Heart:                 Previously seen, EIF   Upper Extremities:      Previously seen  RVOT:                  Appears normal         Lower Extremities:      Previously seen  LVOT:                  Previously seen  Other:  Heels/feet and open hands previously visualized. Nasal bone and          lenses previously visualized. Fetus  appears to be a female. 3VV and          3VTV visualized. ---------------------------------------------------------------------- Cervix Uterus Adnexa  Cervix  Not visualized (advanced GA >24wks)  Uterus  No abnormality visualized.  Right Ovary  Not visualized.  Left Ovary  Not visualized.  Cul De Sac  No free fluid seen.  Adnexa  No adnexal mass visualized. ---------------------------------------------------------------------- Impression  Follow up growth due to elevated BMI and mild  polyhydramnios  Normal interval growth with measurements consistent with  dates  Good fetal movement and mild polyhydramnois again seen.  Ms. Gassert reports good fetal movement.  I discussed the details of today's ultrasound.  Given her elevated BMI I have scheduled Ms. Razavi to return  in 4 weeks and to initiate weekly BPP's at that time until  delivery. ---------------------------------------------------------------------- Recommendations  RTC in 4 weeks and initiate weekly BPP's at that time until  delivery. ----------------------------------------------------------------------               Sander Nephew, MD Electronically Signed Final Report   08/09/2021 11:53 am ----------------------------------------------------------------------   Assessment and Plan:  Pregnancy: G1P0 at [redacted]w[redacted]d 1. Influenza --Viral pneumonia dx 08/23/21, pt with improved symptoms today . --Denies SOB or chest pain --Continue supportive symptom treatment, f/u as needed  2. [redacted] weeks gestation of pregnancy   3. Mild intermittent asthma without complication --Pt using albuterol inhaler and symbicort as directed. Feels like her asthma is well controlled.  Insurance declined QVAR with Rx sent 11/9, but pt doing well without it. --Continue current care and pt to f/u as needed  4. Supervision of other normal pregnancy, antepartum --Pt reports good fetal movement, denies cramping, LOF, or vaginal bleeding --Anticipatory guidance about next visits/weeks  of pregnancy given. --next visit in 1 week for GBS  5. Pneumonia affecting pregnancy in third trimester --Improved, see flu above   Preterm labor symptoms and general obstetric precautions including but not limited to vaginal bleeding, contractions, leaking of fluid and fetal movement were reviewed in detail with the patient. I discussed the assessment and treatment plan with the patient. The patient was provided an opportunity to ask questions and all were answered. The patient agreed with the plan and demonstrated an understanding of the instructions. The patient was advised to call back or seek an in-person office evaluation/go to MAU at [facility] for any urgent or concerning symptoms. Please refer to After Visit Summary for other counseling recommendations.   I provided 7 minutes of face-to-face time during this encounter.  Return in about 1 week (around 09/03/2021).  Future Appointments  Date Time Provider [place]  09/06/2021  9:30 AM [facility] NURSE [facility]  09/06/2021  9:45 AM [facility] US5 [facility]  09/13/2021 11:15 AM [facility] NST [facility] [place] [place] [facility] [employer]  [facility], [place]

## 2021-08-27 NOTE — Progress Notes (Signed)
I connected with  Phyllis Christian on 08/27/21 by a video enabled telemedicine application and verified that I am speaking with the correct person using two identifiers.   I discussed the limitations of evaluation and management by telemedicine. The patient expressed understanding and agreed to proceed.   MyChart ROB, she is having the FLU.

## 2021-09-05 ENCOUNTER — Ambulatory Visit (INDEPENDENT_AMBULATORY_CARE_PROVIDER_SITE_OTHER): Payer: Medicaid Other | Admitting: Women's Health

## 2021-09-05 ENCOUNTER — Other Ambulatory Visit: Payer: Self-pay

## 2021-09-05 ENCOUNTER — Other Ambulatory Visit (HOSPITAL_COMMUNITY)
Admission: RE | Admit: 2021-09-05 | Discharge: 2021-09-05 | Disposition: A | Discharge status: Home / Self Care | Payer: Medicaid Other | Source: Ambulatory Visit | Attending: Women's Health | Admitting: Women's Health

## 2021-09-05 VITALS — BP 125/85 | HR 61 | Wt 257.0 lb

## 2021-09-05 DIAGNOSIS — Z3480 Encounter for supervision of other normal pregnancy, unspecified trimester: Secondary | ICD-10-CM | POA: Diagnosis not present

## 2021-09-05 DIAGNOSIS — G4733 Obstructive sleep apnea (adult) (pediatric): Secondary | ICD-10-CM

## 2021-09-05 DIAGNOSIS — Z3A36 36 weeks gestation of pregnancy: Secondary | ICD-10-CM

## 2021-09-05 DIAGNOSIS — J4520 Mild intermittent asthma, uncomplicated: Secondary | ICD-10-CM

## 2021-09-05 NOTE — Patient Instructions (Signed)
Maternity Assessment Unit (MAU)  The Maternity Assessment Unit (MAU) is located at the [facility] and [place] at [facility]. The address is: [address] [place], [place], [place] [address]. Please see map below for additional directions.    The Maternity Assessment Unit is designed to help you during your pregnancy, and for up to 6 weeks after delivery, with any pregnancy- or postpartum-related emergencies, if you think you are in labor, or if your water has broken. For example, if you experience nausea and vomiting, vaginal bleeding, severe abdominal or pelvic pain, elevated blood pressure or other problems related to your pregnancy or postpartum time, please come to the Maternity Assessment Unit for assistance.       Third Trimester of Pregnancy The third trimester of pregnancy is from week 28 through week 35. This is months 7 through 9. The third trimester is a time when the unborn baby (fetus) is growing rapidly. At the end of the ninth month, the fetus is about 20 inches long and weighs 6-10 pounds. Body changes during your third trimester During the third trimester, your body will continue to go through many changes. The changes vary and generally return to normal after your baby is born. Physical changes Your weight will continue to increase. You can expect to gain 25-35 pounds (11-16 kg) by the end of the pregnancy if you begin pregnancy at a normal weight. If you are underweight, you can expect to gain 28-40 lb (about 13-18 kg), and if you are overweight, you can expect to gain 15-25 lb (about 7-11 kg). You may begin to get stretch marks on your hips, abdomen, and breasts. Your breasts will continue to grow and may hurt. A yellow fluid (colostrum) may leak from your breasts. This is the first milk you are producing for your baby. You may have changes in your hair. These can include thickening of your hair, rapid growth, and changes in texture. Some people also  have hair loss during or after pregnancy, or hair that feels dry or thin. Your belly button may stick out. You may notice more swelling in your hands, face, or ankles. Health changes You may have heartburn. You may have constipation. You may develop hemorrhoids. You may develop swollen, bulging veins (varicose veins) in your legs. You may have increased body aches in the pelvis, back, or thighs. This is due to weight gain and increased hormones that are relaxing your joints. You may have increased tingling or numbness in your hands, arms, and legs. The skin on your abdomen may also feel numb. You may feel short of breath because of your expanding uterus. Other changes You may urinate more often because the fetus is moving lower into your pelvis and pressing on your bladder. You may have more problems sleeping. This may be caused by the size of your abdomen, an increased need to urinate, and an increase in your body's metabolism. You may notice the fetus "dropping," or moving lower in your abdomen (lightening). You may have increased vaginal discharge. You may notice that you have pain around your pelvic bone as your uterus distends. Follow these instructions at home: Medicines Follow your health care provider's instructions regarding medicine use. Specific medicines may be either safe or unsafe to take during pregnancy. Do not take any medicines unless approved by your health care provider. Take a prenatal vitamin that contains at least 600 micrograms (mcg) of folic acid. Eating and drinking Eat a healthy diet that includes fresh fruits  and vegetables, whole grains, good sources of protein such as meat, eggs, or tofu, and low-fat dairy products. Avoid raw meat and unpasteurized juice, milk, and cheese. These carry germs that can harm you and your baby. Eat 4 or 5 small meals rather than 3 large meals a day. You may need to take these actions to prevent or treat constipation: Drink enough  fluid to keep your urine pale yellow. Eat foods that are high in fiber, such as beans, whole grains, and fresh fruits and vegetables. Limit foods that are high in fat and processed sugars, such as fried or sweet foods. Activity Exercise only as directed by your health care provider. Most people can continue their usual exercise routine during pregnancy. Try to exercise for 30 minutes at least 5 days a week. Stop exercising if you experience contractions in the uterus. Stop exercising if you develop pain or cramping in the lower abdomen or lower back. Avoid heavy lifting. Do not exercise if it is very hot or humid or if you are at a high altitude. If you choose to, you may continue to have sex unless your health care provider tells you not to. Relieving pain and discomfort Take frequent breaks and rest with your legs raised (elevated) if you have leg cramps or low back pain. Take warm sitz baths to soothe any pain or discomfort caused by hemorrhoids. Use hemorrhoid cream if your health care provider approves. Wear a supportive bra to prevent discomfort from breast tenderness. If you develop varicose veins: Wear support hose as told by your health care provider. Elevate your feet for 15 minutes, 3-4 times a day. Limit salt in your diet. Safety Talk to your health care provider before traveling far distances. Do not use hot tubs, steam rooms, or saunas. Wear your seat belt at all times when driving or riding in a car. Talk with your health care provider if someone is verbally or physically abusive to you. Preparing for birth To prepare for the arrival of your baby: Take prenatal classes to understand, practice, and ask questions about labor and delivery. Visit the hospital and tour the maternity area. Purchase a rear-facing car seat and make sure you know how to install it in your car. Prepare the baby's room or sleeping area. Make sure to remove all pillows and stuffed animals from the  baby's crib to prevent suffocation. General instructions Avoid cat litter boxes and soil used by cats. These carry germs that can cause birth defects in the baby. If you have a cat, ask someone to clean the litter box for you. Do not douche or use tampons. Do not use scented sanitary pads. Do not use any products that contain nicotine or tobacco, such as cigarettes, e-cigarettes, and chewing tobacco. If you need help quitting, ask your health care provider. Do not use any herbal remedies, illegal drugs, or medicines that were not prescribed to you. Chemicals in these products can harm your baby. Do not drink alcohol. You will have more frequent prenatal exams during the third trimester. During a routine prenatal visit, your health care provider will do a physical exam, perform tests, and discuss your overall health. Keep all follow-up visits. This is important. Where to find more information American Pregnancy Association: americanpregnancy.[place] and Gynecologists: PoolDevices.com.pt Office on [employer] Health: KeywordPortfolios.com.br Contact a health care provider if you have: A fever. Mild pelvic cramps, pelvic pressure, or nagging pain in your abdominal area or lower back. Vomiting or diarrhea. Bad-smelling  vaginal discharge or foul-smelling urine. Pain when you urinate. A headache that does not go away when you take medicine. Visual changes or see spots in front of your eyes. Get help right away if: Your water breaks. You have regular contractions less than 5 minutes apart. You have spotting or bleeding from your vagina. You have severe abdominal pain. You have difficulty breathing. You have chest pain. You have fainting spells. You have not felt your baby move for the time period told by your health care provider. You have new or increased pain, swelling, or redness in an arm or leg. Summary The third trimester of pregnancy is  from week 28 through week 40 (months 7 through 9). You may have more problems sleeping. This can be caused by the size of your abdomen, an increased need to urinate, and an increase in your body's metabolism. You will have more frequent prenatal exams during the third trimester. Keep all follow-up visits. This is important. This information is not intended to replace advice given to you by your health care provider. Make sure you discuss any questions you have with your health care provider. Document Revised: 03/01/2020 Document Reviewed: 01/06/2020 Elsevier Patient Education  2022 [place].       Signs and Symptoms of Labor Labor is the body's natural process of moving the baby and the placenta out of the uterus. The process of labor usually starts when the baby is full-term, between 44 and 41 weeks of pregnancy. Signs and symptoms that you are close to going into labor As your body prepares for labor and the birth of your baby, you may notice the following symptoms in the weeks and days before true labor starts: Passing a small amount of thick, bloody mucus from your vagina. This is called normal bloody show or losing your mucus plug. This may happen more than a week before labor begins, or right before labor begins, as the opening of the cervix starts to widen (dilate). For some women, the entire mucus plug passes at once. For others, pieces of the mucus plug may gradually pass over several days. Your baby moving (dropping) lower in your pelvis to get into position for birth (lightening). When this happens, you may feel more pressure on your bladder and pelvic bone and less pressure on your ribs. This may make it easier to breathe. It may also cause you to need to urinate more often and have problems with bowel movements. Having "practice contractions," also called Braxton Hicks contractions or false labor. These occur at irregular (unevenly spaced) intervals that are more than 10 minutes  apart. False labor contractions are common after exercise or sexual activity. They will stop if you change position, rest, or drink fluids. These contractions are usually mild and do not get stronger over time. They may feel like: A backache or back pain. Mild cramps, similar to menstrual cramps. Tightening or pressure in your abdomen. Other early symptoms include: Nausea or loss of appetite. Diarrhea. Having a sudden burst of energy, or feeling very tired. Mood changes. Having trouble sleeping. Signs and symptoms that labor has begun Signs that you are in labor may include: Having contractions that come at regular (evenly spaced) intervals and increase in intensity. This may feel like more intense tightening or pressure in your abdomen that moves to your back. Contractions may also feel like rhythmic pain in your upper thighs or back that comes and goes at regular intervals. If you are delivering for the first time,  this change in intensity of contractions often occurs at a more gradual pace. If you have given birth before, you may notice a more rapid progression of contraction changes. Feeling pressure in the vaginal area. Your water breaking (rupture of membranes). This is when the sac of fluid that surrounds your baby breaks. Fluid leaking from your vagina may be clear or blood-tinged. Labor usually starts within 24 hours of your water breaking, but it may take longer to begin. Some people may feel a sudden gush of fluid; others may notice repeatedly damp underwear. Follow these instructions at home:  When labor starts, or if your water breaks, call your health care provider or nurse care line. Based on your situation, they will determine when you should go in for an exam. During early labor, you may be able to rest and manage symptoms at home. Some strategies to try at home include: Breathing and relaxation techniques. Taking a warm bath or shower. Listening to music. Using a heating  pad on the lower back for pain. If directed, apply heat to the area as often as told by your health care provider. Use the heat source that your health care provider recommends, such as a moist heat pack or a heating pad. Place a towel between your skin and the heat source. Leave the heat on for 20-30 minutes. Remove the heat if your skin turns bright red. This is especially important if you are unable to feel pain, heat, or cold. You have a greater risk of getting burned. Contact a health care provider if: Your labor has started. Your water breaks. You have nausea, vomiting, or diarrhea. Get help right away if: You have painful, regular contractions that are 5 minutes apart or less. Labor starts before you are [redacted] weeks along in your pregnancy. You have a fever. You have bright red blood coming from your vagina. You do not feel your baby moving. You have a severe headache with or without vision problems. You have chest pain or shortness of breath. These symptoms may represent a serious problem that is an emergency. Do not wait to see if the symptoms will go away. Get medical help right away. Call your local emergency services (911 in the [place] Do not drive yourself to the hospital. Summary Labor is your body's natural process of moving your baby and the placenta out of your uterus. The process of labor usually starts when your baby is full-term, between 57 and 40 weeks of pregnancy. When labor starts, or if your water breaks, call your health care provider or nurse care line. Based on your situation, they will determine when you should go in for an exam. This information is not intended to replace advice given to you by your health care provider. Make sure you discuss any questions you have with your health care provider. Document Revised: 02/06/2021 Document Reviewed: 02/06/2021 Elsevier Patient Education  [place].

## 2021-09-05 NOTE — Progress Notes (Signed)
Subjective:  Phyllis Christian is a 20 y.o. G1P0 at [redacted]w[redacted]d being seen today for ongoing prenatal care.  She is currently monitored for the following issues for this low-risk pregnancy and has Sickle cell trait ([place]); Obstructive sleep apnea; Mild intermittent asthma without complication; Supervision of other normal pregnancy, antepartum; Maternal obesity affecting pregnancy, antepartum; and Echogenic intracardiac focus of fetus on prenatal ultrasound on their problem list.  Patient reports no complaints.  Contractions: Not present. Vag. Bleeding: None.  Movement: Present. Denies leaking of fluid.   The following portions of the patient's history were reviewed and updated as appropriate: allergies, current medications, past family history, past medical history, past social history, past surgical history and problem list. Problem list updated.  Objective:   Vitals:   09/05/21 1134  BP: 125/85  Pulse: 61  Weight: 257 lb (116.6 kg)    Fetal Status: Fetal Heart Rate (bpm): 145 Fundal Height: 36 cm Movement: Present     General:  Alert, oriented and cooperative. Patient is in no acute distress.  Skin: Skin is warm and dry. No rash noted.   Cardiovascular: Normal heart rate noted  Respiratory: Normal respiratory effort, no problems with respiration noted  Abdomen: Soft, gravid, appropriate for gestational age. Pain/Pressure: Absent     Pelvic: Vag. Bleeding: None     Cervical exam deferred Pt declines exam        Extremities: Normal range of motion.  Edema: None  Mental Status: Normal mood and affect. Normal behavior. Normal judgment and thought content.   Urinalysis:      Assessment and Plan:  Pregnancy: G1P0 at [redacted]w[redacted]d  1. Supervision of other normal pregnancy, antepartum -GBS/cultures today PHQ9 SCORE ONLY 09/05/2021 07/18/2021 02/27/2021  PHQ-9 Total Score 4 3 4    GAD 7 : Generalized Anxiety Score 09/05/2021 07/18/2021  Nervous, Anxious, on Edge 0 1  Control/stop worrying 0 0  Worry too  much - different things 0 1  Trouble relaxing 1 1  Restless 0 0  Easily annoyed or irritable 1 1  Afraid - awful might happen 0 0  Total GAD 7 Score 2 4  Anxiety Difficulty Not difficult at all Not difficult at all   2. Mild intermittent asthma without complication - no concerns, on Symbicort and albuterol  3. [redacted] weeks gestation of pregnancy  4. Obstructive sleep apnea -pt reports diagnosis in high school - was diagnosed but never given any treatment or follow-up -family member present for visit says they have not noticed patient stopping to breathe in her sleep -referral to [facility]  Term labor symptoms and general obstetric precautions including but not limited to vaginal bleeding, contractions, leaking of fluid and fetal movement were reviewed in detail with the patient. I discussed the assessment and treatment plan with the patient. The patient was provided an opportunity to ask questions and all were answered. The patient agreed with the plan and demonstrated an understanding of the instructions. The patient was advised to call back or seek an in-person office evaluation/go to MAU at [facility] for any urgent or concerning symptoms. Please refer to After Visit Summary for other counseling recommendations.  Return in about 1 week (around 09/12/2021) for in-person LOB/APP OK.   Chiniqua Kilcrease, Gerrie Nordmann, NP

## 2021-09-06 ENCOUNTER — Ambulatory Visit: Payer: Medicaid Other | Attending: Maternal & Fetal Medicine

## 2021-09-06 ENCOUNTER — Encounter: Payer: Self-pay | Admitting: *Deleted

## 2021-09-06 ENCOUNTER — Ambulatory Visit: Payer: Medicaid Other | Admitting: *Deleted

## 2021-09-06 VITALS — BP 128/73 | HR 73

## 2021-09-06 DIAGNOSIS — Z6841 Body Mass Index (BMI) 40.0 and over, adult: Secondary | ICD-10-CM | POA: Insufficient documentation

## 2021-09-06 DIAGNOSIS — O99210 Obesity complicating pregnancy, unspecified trimester: Secondary | ICD-10-CM | POA: Insufficient documentation

## 2021-09-06 DIAGNOSIS — Z3A36 36 weeks gestation of pregnancy: Secondary | ICD-10-CM | POA: Diagnosis not present

## 2021-09-06 DIAGNOSIS — O403XX0 Polyhydramnios, third trimester, not applicable or unspecified: Secondary | ICD-10-CM | POA: Diagnosis not present

## 2021-09-06 DIAGNOSIS — O283 Abnormal ultrasonic finding on antenatal screening of mother: Secondary | ICD-10-CM | POA: Diagnosis not present

## 2021-09-06 LAB — CERVICOVAGINAL ANCILLARY ONLY
Chlamydia: NEGATIVE
Comment: NEGATIVE
Comment: NORMAL
Neisseria Gonorrhea: NEGATIVE

## 2021-09-09 LAB — CULTURE, BETA STREP (GROUP B ONLY): Strep Gp B Culture: NEGATIVE

## 2021-09-12 ENCOUNTER — Encounter: Payer: Self-pay | Admitting: Obstetrics & Gynecology

## 2021-09-12 ENCOUNTER — Ambulatory Visit (INDEPENDENT_AMBULATORY_CARE_PROVIDER_SITE_OTHER): Payer: Medicaid Other | Admitting: Obstetrics & Gynecology

## 2021-09-12 ENCOUNTER — Other Ambulatory Visit: Payer: Self-pay

## 2021-09-12 VITALS — BP 133/80 | HR 73 | Wt 268.0 lb

## 2021-09-12 DIAGNOSIS — D573 Sickle-cell trait: Secondary | ICD-10-CM

## 2021-09-12 DIAGNOSIS — Z3480 Encounter for supervision of other normal pregnancy, unspecified trimester: Secondary | ICD-10-CM

## 2021-09-12 DIAGNOSIS — G4733 Obstructive sleep apnea (adult) (pediatric): Secondary | ICD-10-CM

## 2021-09-12 DIAGNOSIS — O99210 Obesity complicating pregnancy, unspecified trimester: Secondary | ICD-10-CM

## 2021-09-12 NOTE — Progress Notes (Signed)
   PRENATAL VISIT NOTE  Subjective:  Phyllis Christian is a 20 y.o. G1P0 at [redacted]w[redacted]d being seen today for ongoing prenatal care.  She is currently monitored for the following issues for this high-risk pregnancy and has Sickle cell trait ([place]); Obstructive sleep apnea; Mild intermittent asthma without complication; Supervision of other normal pregnancy, antepartum; Maternal obesity affecting pregnancy, antepartum; and Echogenic intracardiac focus of fetus on prenatal ultrasound on their problem list.  Patient reports no complaints.  Contractions: Not present. Vag. Bleeding: None.  Movement: Present. Denies leaking of fluid.   The following portions of the patient's history were reviewed and updated as appropriate: allergies, current medications, past family history, past medical history, past social history, past surgical history and problem list.   Objective:   Vitals:   09/12/21 0905 09/12/21 0917  BP: (!) 151/87 133/80  Pulse: 75 73  Weight: 268 lb (121.6 kg)     Fetal Status: Fetal Heart Rate (bpm): 140   Movement: Present  Presentation: Undeterminable  General:  Alert, oriented and cooperative. Patient is in no acute distress.  Skin: Skin is warm and dry. No rash noted.   Cardiovascular: Normal heart rate noted  Respiratory: Normal respiratory effort, no problems with respiration noted  Abdomen: Soft, gravid, appropriate for gestational age.  Pain/Pressure: Present     Pelvic: Cervical exam performed in the presence of a chaperone Dilation: Closed Effacement (%): 20 Station: Ballotable  Extremities: Normal range of motion.  Edema: None  Mental Status: Normal mood and affect. Normal behavior. Normal judgment and thought content.   Pregnancy: G1P0 at [redacted]w[redacted]d 1. Maternal obesity affecting pregnancy, antepartum Body mass index is 44.6 kg/m.   2. Supervision of other normal pregnancy, antepartum Normal fetal growth  3. Obstructive sleep apnea   4. Sickle cell trait ([place])   Term labor  symptoms and general obstetric precautions including but not limited to vaginal bleeding, contractions, leaking of fluid and fetal movement were reviewed in detail with the patient. Please refer to After Visit Summary for other counseling recommendations.   Return in about 1 week (around 09/19/2021).  Future Appointments  Date Time Provider [place]  09/13/2021 11:15 AM [facility]  09/19/2021  8:35 AM Shelly Bombard, MD [facility] None    Emeterio Reeve, MD

## 2021-09-12 NOTE — Progress Notes (Signed)
ROB [redacted]w[redacted]d GBS Neg on 09/05/21 Flu vaccine offered : Declined   Pt would like to have cervix check.   *Pt denies any visual changes or Headaches first B/P reading elevated.  Repeated 133/80 P:73  CC: None

## 2021-09-13 ENCOUNTER — Ambulatory Visit: Payer: Medicaid Other | Admitting: *Deleted

## 2021-09-13 ENCOUNTER — Ambulatory Visit (INDEPENDENT_AMBULATORY_CARE_PROVIDER_SITE_OTHER): Payer: Medicaid Other

## 2021-09-13 DIAGNOSIS — O99210 Obesity complicating pregnancy, unspecified trimester: Secondary | ICD-10-CM | POA: Diagnosis not present

## 2021-09-13 NOTE — Progress Notes (Signed)

## 2021-09-18 ENCOUNTER — Ambulatory Visit: Payer: Medicaid Other | Admitting: *Deleted

## 2021-09-18 ENCOUNTER — Encounter: Payer: Self-pay | Admitting: *Deleted

## 2021-09-18 ENCOUNTER — Other Ambulatory Visit: Payer: Self-pay

## 2021-09-18 ENCOUNTER — Ambulatory Visit: Payer: Medicaid Other | Attending: Obstetrics & Gynecology

## 2021-09-18 VITALS — BP 137/84 | HR 81

## 2021-09-18 DIAGNOSIS — O99210 Obesity complicating pregnancy, unspecified trimester: Secondary | ICD-10-CM

## 2021-09-18 DIAGNOSIS — O283 Abnormal ultrasonic finding on antenatal screening of mother: Secondary | ICD-10-CM | POA: Diagnosis not present

## 2021-09-19 ENCOUNTER — Ambulatory Visit (INDEPENDENT_AMBULATORY_CARE_PROVIDER_SITE_OTHER): Payer: Medicaid Other | Admitting: Obstetrics

## 2021-09-19 ENCOUNTER — Encounter: Payer: Self-pay | Admitting: Obstetrics

## 2021-09-19 ENCOUNTER — Other Ambulatory Visit: Payer: Self-pay | Admitting: Advanced Practice Midwife

## 2021-09-19 VITALS — BP 136/90 | HR 80 | Wt 274.0 lb

## 2021-09-19 DIAGNOSIS — O133 Gestational [pregnancy-induced] hypertension without significant proteinuria, third trimester: Secondary | ICD-10-CM

## 2021-09-19 DIAGNOSIS — O99210 Obesity complicating pregnancy, unspecified trimester: Secondary | ICD-10-CM

## 2021-09-19 DIAGNOSIS — J4520 Mild intermittent asthma, uncomplicated: Secondary | ICD-10-CM

## 2021-09-19 DIAGNOSIS — O0990 Supervision of high risk pregnancy, unspecified, unspecified trimester: Secondary | ICD-10-CM

## 2021-09-19 DIAGNOSIS — O163 Unspecified maternal hypertension, third trimester: Secondary | ICD-10-CM | POA: Diagnosis not present

## 2021-09-19 NOTE — Progress Notes (Signed)
Subjective:  Phyllis Christian is a 20 y.o. G1P0 at [redacted]w[redacted]d being seen today for ongoing prenatal care.  She is currently monitored for the following issues for this high-risk pregnancy and has Sickle cell trait ([place]); Obstructive sleep apnea; Mild intermittent asthma without complication; Supervision of other normal pregnancy, antepartum; Maternal obesity affecting pregnancy, antepartum; and Echogenic intracardiac focus of fetus on prenatal ultrasound on their problem list.  Patient reports headache.  Contractions: Not present. Vag. Bleeding: None.  Movement: Present. Denies leaking of fluid.   The following portions of the patient's history were reviewed and updated as appropriate: allergies, current medications, past family history, past medical history, past social history, past surgical history and problem list. Problem list updated.  Objective:   Vitals:   09/19/21 0844 09/19/21 0847  BP: (!) 139/91 136/90  Pulse: 80 80  Weight: 274 lb (124.3 kg)     Fetal Status: Fetal Heart Rate (bpm): 138   Movement: Present     General:  Alert, oriented and cooperative. Patient is in no acute distress.  Skin: Skin is warm and dry. No rash noted.   Cardiovascular: Normal heart rate noted  Respiratory: Normal respiratory effort, no problems with respiration noted  Abdomen: Soft, gravid, appropriate for gestational age. Pain/Pressure: Present     Pelvic:  Cervical exam deferred        Extremities: Normal range of motion.  Edema: None  Mental Status: Normal mood and affect. Normal behavior. Normal judgment and thought content.   Urinalysis:      Assessment and Plan:  Pregnancy: G1P0 at [redacted]w[redacted]d  1. Supervision of high risk pregnancy, antepartum  2. Gestational hypertension, third trimester Rx: - Lactate dehydrogenase - Creatinine, serum - CBC - ALT - AST - Protein / creatinine ratio, urine - IOL at 39-40 weeks  3. Maternal obesity affecting pregnancy, antepartum  4. Mild intermittent asthma  without complication - clinically stable    Term labor symptoms and general obstetric precautions including but not limited to vaginal bleeding, contractions, leaking of fluid and fetal movement were reviewed in detail with the patient. Please refer to After Visit Summary for other counseling recommendations.   Return in about 1 week (around 09/26/2021) for [facility].   Shelly Bombard, MD  09/19/21

## 2021-09-20 ENCOUNTER — Other Ambulatory Visit: Payer: Self-pay | Admitting: Obstetrics

## 2021-09-20 DIAGNOSIS — O99013 Anemia complicating pregnancy, third trimester: Secondary | ICD-10-CM

## 2021-09-20 LAB — AST: AST: 8 IU/L (ref 0–40)

## 2021-09-20 LAB — CBC
Hematocrit: 31.5 % — ABNORMAL LOW (ref 34.0–46.6)
Hemoglobin: 10.1 g/dL — ABNORMAL LOW (ref 11.1–15.9)
MCH: 25.3 pg — ABNORMAL LOW (ref 26.6–33.0)
MCHC: 32.1 g/dL (ref 31.5–35.7)
MCV: 79 fL (ref 79–97)
Platelets: 269 10*3/uL (ref 150–450)
RBC: 3.99 x10E6/uL (ref 3.77–5.28)
RDW: 15.1 % (ref 11.7–15.4)
WBC: 12.7 10*3/uL — ABNORMAL HIGH (ref 3.4–10.8)

## 2021-09-20 LAB — CREATININE, SERUM
Creatinine, Ser: 0.78 mg/dL (ref 0.57–1.00)
eGFR: 111 mL/min/{1.73_m2} (ref >59)

## 2021-09-20 LAB — LACTATE DEHYDROGENASE: LDH: 170 IU/L (ref 119–226)

## 2021-09-20 LAB — PROTEIN / CREATININE RATIO, URINE
Creatinine, Urine: 51.3 mg/dL
Protein, Ur: 7 mg/dL
Protein/Creat Ratio: 136 mg/g creat (ref 0–200)

## 2021-09-20 LAB — ALT: ALT: 12 IU/L (ref 0–32)

## 2021-09-20 MED ORDER — IRON POLYSACCH CMPLX-B12-FA 150-0.025-1 MG PO CAPS
1.0000 | ORAL_CAPSULE | ORAL | 5 refills | Status: DC
Start: 2021-09-20 — End: 2023-07-28

## 2021-09-26 ENCOUNTER — Encounter (HOSPITAL_COMMUNITY): Payer: Self-pay | Admitting: Family Medicine

## 2021-09-26 ENCOUNTER — Inpatient Hospital Stay (HOSPITAL_COMMUNITY)
Admission: AD | Admit: 2021-09-26 | Discharge: 2021-09-29 | DRG: 787 | Disposition: A | Discharge status: Home / Self Care | Payer: Medicaid Other | Attending: Family Medicine | Admitting: Family Medicine

## 2021-09-26 ENCOUNTER — Encounter: Payer: Medicaid Other | Admitting: Obstetrics and Gynecology

## 2021-09-26 ENCOUNTER — Other Ambulatory Visit: Payer: Self-pay

## 2021-09-26 ENCOUNTER — Inpatient Hospital Stay (HOSPITAL_COMMUNITY): Payer: Medicaid Other

## 2021-09-26 DIAGNOSIS — O9952 Diseases of the respiratory system complicating childbirth: Secondary | ICD-10-CM | POA: Diagnosis not present

## 2021-09-26 DIAGNOSIS — D62 Acute posthemorrhagic anemia: Secondary | ICD-10-CM | POA: Diagnosis not present

## 2021-09-26 DIAGNOSIS — O99893 Other specified diseases and conditions complicating puerperium: Secondary | ICD-10-CM | POA: Diagnosis not present

## 2021-09-26 DIAGNOSIS — O9081 Anemia of the puerperium: Secondary | ICD-10-CM | POA: Diagnosis not present

## 2021-09-26 DIAGNOSIS — R1310 Dysphagia, unspecified: Secondary | ICD-10-CM | POA: Diagnosis not present

## 2021-09-26 DIAGNOSIS — O134 Gestational [pregnancy-induced] hypertension without significant proteinuria, complicating childbirth: Secondary | ICD-10-CM | POA: Diagnosis not present

## 2021-09-26 DIAGNOSIS — O99210 Obesity complicating pregnancy, unspecified trimester: Secondary | ICD-10-CM | POA: Diagnosis present

## 2021-09-26 DIAGNOSIS — O139 Gestational [pregnancy-induced] hypertension without significant proteinuria, unspecified trimester: Secondary | ICD-10-CM | POA: Diagnosis present

## 2021-09-26 DIAGNOSIS — O365930 Maternal care for other known or suspected poor fetal growth, third trimester, not applicable or unspecified: Secondary | ICD-10-CM | POA: Diagnosis not present

## 2021-09-26 DIAGNOSIS — J4520 Mild intermittent asthma, uncomplicated: Secondary | ICD-10-CM | POA: Diagnosis present

## 2021-09-26 DIAGNOSIS — O9902 Anemia complicating childbirth: Secondary | ICD-10-CM | POA: Diagnosis not present

## 2021-09-26 DIAGNOSIS — Z3A39 39 weeks gestation of pregnancy: Secondary | ICD-10-CM | POA: Diagnosis not present

## 2021-09-26 DIAGNOSIS — O99214 Obesity complicating childbirth: Secondary | ICD-10-CM | POA: Diagnosis present

## 2021-09-26 DIAGNOSIS — D573 Sickle-cell trait: Secondary | ICD-10-CM | POA: Diagnosis present

## 2021-09-26 DIAGNOSIS — O133 Gestational [pregnancy-induced] hypertension without significant proteinuria, third trimester: Secondary | ICD-10-CM

## 2021-09-26 LAB — COMPREHENSIVE METABOLIC PANEL
ALT: 11 U/L (ref 0–44)
AST: 12 U/L — ABNORMAL LOW (ref 15–41)
Albumin: 2.7 g/dL — ABNORMAL LOW (ref 3.5–5.0)
Alkaline Phosphatase: 181 U/L — ABNORMAL HIGH (ref 38–126)
Anion gap: 8 (ref 5–15)
BUN: 7 mg/dL (ref 6–20)
CO2: 19 mmol/L — ABNORMAL LOW (ref 22–32)
Calcium: 8.1 mg/dL — ABNORMAL LOW (ref 8.9–10.3)
Chloride: 107 mmol/L (ref 98–111)
Creatinine, Ser: 0.79 mg/dL (ref 0.44–1.00)
GFR, Estimated: >60 mL/min (ref >60)
Glucose, Bld: 74 mg/dL (ref 70–99)
Potassium: 4.2 mmol/L (ref 3.5–5.1)
Sodium: 134 mmol/L — ABNORMAL LOW (ref 135–145)
Total Bilirubin: 0.4 mg/dL (ref 0.3–1.2)
Total Protein: 5.8 g/dL — ABNORMAL LOW (ref 6.5–8.1)

## 2021-09-26 LAB — CBC
HCT: 31.8 % — ABNORMAL LOW (ref 36.0–46.0)
Hemoglobin: 10.4 g/dL — ABNORMAL LOW (ref 12.0–15.0)
MCH: 25.8 pg — ABNORMAL LOW (ref 26.0–34.0)
MCHC: 32.7 g/dL (ref 30.0–36.0)
MCV: 78.9 fL — ABNORMAL LOW (ref 80.0–100.0)
Platelets: 273 10*3/uL (ref 150–400)
RBC: 4.03 MIL/uL (ref 3.87–5.11)
RDW: 15.7 % — ABNORMAL HIGH (ref 11.5–15.5)
WBC: 14.2 10*3/uL — ABNORMAL HIGH (ref 4.0–10.5)
nRBC: 0.2 % (ref 0.0–0.2)

## 2021-09-26 LAB — PROTEIN / CREATININE RATIO, URINE
Creatinine, Urine: 84.46 mg/dL
Protein Creatinine Ratio: 0.18 mg/mg{Cre} — ABNORMAL HIGH (ref 0.00–0.15)
Total Protein, Urine: 15 mg/dL

## 2021-09-26 MED ORDER — LABETALOL HCL 5 MG/ML IV SOLN
INTRAVENOUS | Status: AC
  Filled 2021-09-26: qty 4

## 2021-09-26 MED ORDER — LACTATED RINGERS IV SOLN: INTRAVENOUS | Status: DC

## 2021-09-26 MED ORDER — LABETALOL HCL 5 MG/ML IV SOLN: 40.0000 mg | INTRAVENOUS | Status: DC | PRN

## 2021-09-26 MED ORDER — SOD CITRATE-CITRIC ACID 500-334 MG/5ML PO SOLN
30.0000 mL | ORAL | Status: DC | PRN
  Administered 2021-09-27: 10:00:00 30 mL via ORAL

## 2021-09-26 MED ORDER — OXYTOCIN BOLUS FROM INFUSION: 333.0000 mL | Freq: Once | INTRAVENOUS | Status: DC

## 2021-09-26 MED ORDER — TERBUTALINE SULFATE 1 MG/ML IJ SOLN
0.2500 mg | Freq: Once | INTRAMUSCULAR | Status: AC | PRN
  Administered 2021-09-26: 17:00:00 0.25 mg via SUBCUTANEOUS

## 2021-09-26 MED ORDER — MISOPROSTOL 25 MCG QUARTER TABLET: 25.0000 ug | ORAL_TABLET | ORAL | Status: DC | PRN

## 2021-09-26 MED ORDER — FENTANYL CITRATE (PF) 100 MCG/2ML IJ SOLN
100.0000 ug | INTRAMUSCULAR | Status: DC | PRN
  Administered 2021-09-26: 17:00:00 100 ug via INTRAVENOUS
  Filled 2021-09-26: qty 2

## 2021-09-26 MED ORDER — ONDANSETRON HCL 4 MG/2ML IJ SOLN: 4.0000 mg | Freq: Four times a day (QID) | INTRAMUSCULAR | Status: DC | PRN

## 2021-09-26 MED ORDER — OXYTOCIN-SODIUM CHLORIDE 30-0.9 UT/500ML-% IV SOLN
2.5000 [IU]/h | INTRAVENOUS | Status: DC
  Filled 2021-09-26: qty 500

## 2021-09-26 MED ORDER — LIDOCAINE HCL (PF) 1 % IJ SOLN: 30.0000 mL | INTRAMUSCULAR | Status: DC | PRN

## 2021-09-26 MED ORDER — HYDRALAZINE HCL 20 MG/ML IJ SOLN
5.0000 mg | INTRAMUSCULAR | Status: DC | PRN
  Administered 2021-09-26: 5 mg via INTRAVENOUS

## 2021-09-26 MED ORDER — ALBUTEROL SULFATE (2.5 MG/3ML) 0.083% IN NEBU: 2.5000 mg | INHALATION_SOLUTION | RESPIRATORY_TRACT | Status: DC | PRN

## 2021-09-26 MED ORDER — ACETAMINOPHEN 325 MG PO TABS: 650.0000 mg | ORAL_TABLET | ORAL | Status: DC | PRN

## 2021-09-26 MED ORDER — LABETALOL HCL 5 MG/ML IV SOLN
20.0000 mg | INTRAVENOUS | Status: DC | PRN
  Administered 2021-09-26: 21:00:00 20 mg via INTRAVENOUS

## 2021-09-26 MED ORDER — ALBUTEROL SULFATE (2.5 MG/3ML) 0.083% IN NEBU
2.5000 mg | INHALATION_SOLUTION | RESPIRATORY_TRACT | Status: DC | PRN
  Administered 2021-09-26 – 2021-09-27 (×2): 2.5 mg via RESPIRATORY_TRACT
  Filled 2021-09-26 (×2): qty 3

## 2021-09-26 MED ORDER — HYDRALAZINE HCL 20 MG/ML IJ SOLN
10.0000 mg | INTRAMUSCULAR | Status: DC | PRN
  Administered 2021-09-26: 21:00:00 10 mg via INTRAVENOUS
  Filled 2021-09-26: qty 1

## 2021-09-26 MED ORDER — LACTATED RINGERS IV SOLN
500.0000 mL | INTRAVENOUS | Status: DC | PRN
  Administered 2021-09-26 (×2): 500 mL via INTRAVENOUS

## 2021-09-26 MED ORDER — BUTORPHANOL TARTRATE 1 MG/ML IJ SOLN
2.0000 mg | Freq: Once | INTRAMUSCULAR | Status: AC
  Administered 2021-09-26: 23:00:00 2 mg via INTRAVENOUS
  Filled 2021-09-26: qty 2

## 2021-09-26 NOTE — H&P (Signed)
Phyllis Christian is a 20 y.o. G1P0 female at [redacted]w[redacted]d by 8wk u/s presenting for IOL due to gHTN.   Reports active fetal movement, contractions: none, vaginal bleeding: none, membranes: intact.  Initiated prenatal care at [facility] at 12 wks.   Most recent u/s : 36.5wks, EFW 23rd%, AFI 16cm, fundal placenta, ceph.   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    Past Surgical History: Past Surgical History:  Procedure Laterality Date   NO PAST SURGERIES      Obstetrical History: OB History     Gravida  1   Para      Term      Preterm      AB      Living         SAB      IAB      Ectopic      Multiple      Live Births              Social History: Social History   Socioeconomic History   Marital status: Single    Spouse name: Not on file   Number of children: Not on file   Years of education: Not on file   Highest education level: Not on file  Occupational History   Not on file  Tobacco Use   Smoking status: Never    Passive exposure: Yes   Smokeless tobacco: Never   Tobacco comments:    outside smoker  Vaping Use   Vaping Use: Never used  Substance and Sexual Activity   Alcohol use: No   Drug use: No   Sexual activity: Not Currently  Other Topics Concern   Not on file  Social History Narrative   Patient lives with Mom and Dad and two brothers. Per mom, both her and dad smoke but they mostly smoke outside. No pets in the home.   Social Determinants of Health   Financial Resource Strain: Not on file  Food Insecurity: Not on file  Transportation Needs: Not on file  Physical Activity: Not on  file  Stress: Not on file  Social Connections: Not on file    Family History: Family History  Problem Relation Age of Onset   Diabetes Maternal Grandfather    Obesity Mother    Diabetes Mother    Asthma Father     Allergies: Allergies  Allergen Reactions   Codeine Hives   Motrin [Ibuprofen] Hives    Medications Prior to Admission  Medication Sig Dispense Refill Last Dose   albuterol (PROVENTIL) (2.5 MG/3ML) 0.083% nebulizer solution Take 3 mLs (2.5 mg total) by nebulization every 4 (four) hours as needed for wheezing or shortness of breath. 75 mL 2    albuterol (VENTOLIN HFA) 108 (90 Base) MCG/ACT inhaler Inhale 2 puffs into the lungs every 4 (four) hours as needed for wheezing (or cough). Shake well; always use spacer. 18 g 0    ascorbic acid (VITAMIN C) 500 MG tablet Take 1 tablet (500 mg total) by mouth every other day. Take with iron pill (Patient not taking:  Reported on 09/12/2021) 30 tablet 3    Iron Polysacch Cmplx-B12-FA 150-0.025-1 MG CAPS Take 1 capsule by mouth every other day. 30 capsule 5    Prenatal Vit-Fe Fumarate-FA (PRENATAL VITAMIN) 27-0.8 MG TABS Take 1 tablet by mouth daily. 30 tablet 10     Review of Systems  Pertinent pos/neg as indicated in HPI  Weight 124.8 kg, last menstrual period 12/03/2020. General appearance: alert, cooperative, and no distress Lungs: clear to auscultation bilaterally Heart: regular rate and rhythm Abdomen: gravid, soft, non-tender, EFW by Leopold's approximately 7lbs Extremities: 1+ edema DTR's nl  Fetal monitoring: FHR: 150s bpm, variability: minimal ,  Accelerations: some 10x10 accels, decelerations:  Present  some variables with each ctx Uterine activity: irreg   Presentation: cephalic   Prenatal labs: ABO, Rh: A/Positive/-- (06/14 1131) Antibody: Negative (06/14 1131) Rubella: 3.27 (06/14 1131) RPR: Non Reactive (10/12 0809)  HBsAg: Negative (06/14 1131)  HIV: Non Reactive (10/12 0809)  GBS: Negative/-- (11/30  1152)  2hr GTT: 77/66/66  Prenatal Transfer Tool  Maternal Diabetes: No Genetic Screening: Normal Maternal Ultrasounds/Referrals: Normal Fetal Ultrasounds or other Referrals:  None Maternal Substance Abuse:  No Significant Maternal Medications:  Meds include: Other: albuterol inhaler Significant Maternal Lab Results: Group B Strep negative  No results found for this or any previous visit (from the past 24 hour(s)).   Assessment:  [redacted]w[redacted]d SIUP  G1P0  gHTN  Cat 2 FHR  GBS Negative/-- (11/30 1152)  Plan:  -Admit to L&D  -IV pain meds/epidural prn active labor  -Cervical foley to start due to decreased FHR variability; discussed with patient and family that the [place] tracing isn't reactive; we will monitor closely and try to take her induction slow so as not to stress the baby; may need to consider a C/S if the baby doesn't tolerate labor  -Hopeful for vag delivery   -Collect pre-e labs  -Plans to breastfeed  -Contraception: unsure  -Circumcision: yes  Myrtis Ser CNM 09/26/2021, 12:42 PM

## 2021-09-26 NOTE — Progress Notes (Signed)
Patient ID: Phyllis Christian, female   DOB: 12-Apr-2001, 20 y.o.   MRN: [id number]  Feeling some vag pressure but better than earlier; having some higher SBPs; still waiting on urine sample for P/C ratio; denies s/s pre-e; cervical foley still in place  BPs 179/79, 161/77 FHR 140-150s, min LTV, some 10 x 10 accels, variables w some ctx Irreg ctx Cx deferred  IUP@39 .4wks gHTN- elevated SBPs Unfavorable cx  -Will give IV antihypertensives for now; await P/C ratio; suspect elevated SBPs is from pain as DBPs are nl -Plan for Pitocin when foley comes out  Myrtis Ser CNM 09/26/2021 9:22 PM

## 2021-09-27 ENCOUNTER — Inpatient Hospital Stay (HOSPITAL_COMMUNITY): Payer: Medicaid Other | Admitting: Anesthesiology

## 2021-09-27 ENCOUNTER — Encounter (HOSPITAL_COMMUNITY): Payer: Self-pay | Admitting: Family Medicine

## 2021-09-27 ENCOUNTER — Encounter (HOSPITAL_COMMUNITY)
Admission: AD | Disposition: A | Discharge status: Home / Self Care | Payer: Self-pay | Source: Home / Self Care | Attending: Family Medicine

## 2021-09-27 DIAGNOSIS — O134 Gestational [pregnancy-induced] hypertension without significant proteinuria, complicating childbirth: Secondary | ICD-10-CM | POA: Diagnosis not present

## 2021-09-27 DIAGNOSIS — O9952 Diseases of the respiratory system complicating childbirth: Secondary | ICD-10-CM | POA: Diagnosis not present

## 2021-09-27 DIAGNOSIS — D573 Sickle-cell trait: Secondary | ICD-10-CM | POA: Diagnosis not present

## 2021-09-27 DIAGNOSIS — O99893 Other specified diseases and conditions complicating puerperium: Secondary | ICD-10-CM | POA: Diagnosis not present

## 2021-09-27 DIAGNOSIS — O9902 Anemia complicating childbirth: Secondary | ICD-10-CM | POA: Diagnosis not present

## 2021-09-27 DIAGNOSIS — O99214 Obesity complicating childbirth: Secondary | ICD-10-CM | POA: Diagnosis not present

## 2021-09-27 DIAGNOSIS — R1310 Dysphagia, unspecified: Secondary | ICD-10-CM | POA: Diagnosis not present

## 2021-09-27 DIAGNOSIS — Z3A00 Weeks of gestation of pregnancy not specified: Secondary | ICD-10-CM | POA: Diagnosis not present

## 2021-09-27 DIAGNOSIS — D62 Acute posthemorrhagic anemia: Secondary | ICD-10-CM | POA: Diagnosis not present

## 2021-09-27 DIAGNOSIS — O164 Unspecified maternal hypertension, complicating childbirth: Secondary | ICD-10-CM | POA: Diagnosis not present

## 2021-09-27 DIAGNOSIS — Z3A39 39 weeks gestation of pregnancy: Secondary | ICD-10-CM | POA: Diagnosis not present

## 2021-09-27 DIAGNOSIS — O365930 Maternal care for other known or suspected poor fetal growth, third trimester, not applicable or unspecified: Secondary | ICD-10-CM | POA: Diagnosis not present

## 2021-09-27 DIAGNOSIS — J4520 Mild intermittent asthma, uncomplicated: Secondary | ICD-10-CM | POA: Diagnosis not present

## 2021-09-27 LAB — CBC
HCT: 30.8 % — ABNORMAL LOW (ref 36.0–46.0)
Hemoglobin: 10.2 g/dL — ABNORMAL LOW (ref 12.0–15.0)
MCH: 26 pg (ref 26.0–34.0)
MCHC: 33.1 g/dL (ref 30.0–36.0)
MCV: 78.4 fL — ABNORMAL LOW (ref 80.0–100.0)
Platelets: 247 10*3/uL (ref 150–400)
RBC: 3.93 MIL/uL (ref 3.87–5.11)
RDW: 15.9 % — ABNORMAL HIGH (ref 11.5–15.5)
WBC: 17.3 10*3/uL — ABNORMAL HIGH (ref 4.0–10.5)
nRBC: 0 % (ref 0.0–0.2)

## 2021-09-27 LAB — RPR: RPR Ser Ql: NONREACTIVE

## 2021-09-27 SURGERY — Surgical Case
Anesthesia: Epidural | Site: Abdomen | Wound class: Clean Contaminated

## 2021-09-27 MED ORDER — SIMETHICONE 80 MG PO CHEW: 80.0000 mg | CHEWABLE_TABLET | ORAL | Status: DC | PRN

## 2021-09-27 MED ORDER — NALOXONE HCL 4 MG/10ML IJ SOLN
1.0000 ug/kg/h | INTRAVENOUS | Status: DC | PRN
  Filled 2021-09-27: qty 5

## 2021-09-27 MED ORDER — DIPHENHYDRAMINE HCL 50 MG/ML IJ SOLN: 12.5000 mg | INTRAMUSCULAR | Status: DC | PRN

## 2021-09-27 MED ORDER — LACTATED RINGERS IV SOLN: 500.0000 mL | Freq: Once | INTRAVENOUS | Status: DC

## 2021-09-27 MED ORDER — ACETAMINOPHEN 10 MG/ML IV SOLN: 1000.0000 mg | Freq: Once | INTRAVENOUS | Status: DC | PRN

## 2021-09-27 MED ORDER — OXYTOCIN-SODIUM CHLORIDE 30-0.9 UT/500ML-% IV SOLN
1.0000 m[IU]/min | INTRAVENOUS | Status: DC
  Administered 2021-09-27: 03:00:00 1 m[IU]/min via INTRAVENOUS

## 2021-09-27 MED ORDER — DEXTROSE 5 % IV SOLN
INTRAVENOUS | Status: DC | PRN
  Administered 2021-09-27: 10:00:00 3 g via INTRAVENOUS

## 2021-09-27 MED ORDER — ACETAMINOPHEN 500 MG PO TABS: 1000.0000 mg | ORAL_TABLET | Freq: Four times a day (QID) | ORAL | Status: DC

## 2021-09-27 MED ORDER — LIDOCAINE-EPINEPHRINE (PF) 2 %-1:200000 IJ SOLN
INTRAMUSCULAR | Status: DC | PRN
  Administered 2021-09-27: 5 mL via EPIDURAL

## 2021-09-27 MED ORDER — GLUCAGON HCL RDNA (DIAGNOSTIC) 1 MG IJ SOLR
1.0000 mg | Freq: Once | INTRAMUSCULAR | Status: AC
Start: 2021-09-27 — End: 2021-09-27
  Administered 2021-09-27: 20:00:00 1 mg via INTRAVENOUS
  Filled 2021-09-27: qty 1

## 2021-09-27 MED ORDER — NALOXONE HCL 0.4 MG/ML IJ SOLN: 0.4000 mg | INTRAMUSCULAR | Status: DC | PRN

## 2021-09-27 MED ORDER — MIDAZOLAM HCL (PF) 5 MG/ML IJ SOLN
INTRAMUSCULAR | Status: DC | PRN
  Administered 2021-09-27: 2 mg via INTRAVENOUS

## 2021-09-27 MED ORDER — FENTANYL-BUPIVACAINE-NACL 0.5-0.125-0.9 MG/250ML-% EP SOLN
12.0000 mL/h | EPIDURAL | Status: DC | PRN
  Administered 2021-09-27: 08:00:00 12 mL/h via EPIDURAL
  Filled 2021-09-27: qty 250

## 2021-09-27 MED ORDER — FENTANYL CITRATE (PF) 100 MCG/2ML IJ SOLN
INTRAMUSCULAR | Status: AC
  Filled 2021-09-27: qty 2

## 2021-09-27 MED ORDER — MENTHOL 3 MG MT LOZG: 1.0000 | LOZENGE | OROMUCOSAL | Status: DC | PRN

## 2021-09-27 MED ORDER — SENNOSIDES-DOCUSATE SODIUM 8.6-50 MG PO TABS
2.0000 | ORAL_TABLET | Freq: Every day | ORAL | Status: DC
  Administered 2021-09-28: 10:00:00 2 via ORAL
  Filled 2021-09-27: qty 2

## 2021-09-27 MED ORDER — ACETAMINOPHEN 500 MG PO TABS
1000.0000 mg | ORAL_TABLET | Freq: Four times a day (QID) | ORAL | Status: DC
  Filled 2021-09-27: qty 2

## 2021-09-27 MED ORDER — PROPOFOL 10 MG/ML IV BOLUS
INTRAVENOUS | Status: AC
  Filled 2021-09-27: qty 20

## 2021-09-27 MED ORDER — DEXAMETHASONE SODIUM PHOSPHATE 10 MG/ML IJ SOLN
INTRAMUSCULAR | Status: DC | PRN
  Administered 2021-09-27: 10 mg via INTRAVENOUS

## 2021-09-27 MED ORDER — SCOPOLAMINE 1 MG/3DAYS TD PT72: 1.0000 | MEDICATED_PATCH | Freq: Once | TRANSDERMAL | Status: DC

## 2021-09-27 MED ORDER — PHENYLEPHRINE 40 MCG/ML (10ML) SYRINGE FOR IV PUSH (FOR BLOOD PRESSURE SUPPORT): 80.0000 ug | PREFILLED_SYRINGE | INTRAVENOUS | Status: DC | PRN

## 2021-09-27 MED ORDER — DIPHENHYDRAMINE HCL 25 MG PO CAPS: 25.0000 mg | ORAL_CAPSULE | Freq: Four times a day (QID) | ORAL | Status: DC | PRN

## 2021-09-27 MED ORDER — TETANUS-DIPHTH-ACELL PERTUSSIS 5-2.5-18.5 LF-MCG/0.5 IM SUSY: 0.5000 mL | PREFILLED_SYRINGE | Freq: Once | INTRAMUSCULAR | Status: DC

## 2021-09-27 MED ORDER — ONDANSETRON HCL 4 MG/2ML IJ SOLN
INTRAMUSCULAR | Status: DC | PRN
  Administered 2021-09-27: 4 mg via INTRAVENOUS

## 2021-09-27 MED ORDER — SODIUM CHLORIDE 0.9% FLUSH: 3.0000 mL | INTRAVENOUS | Status: DC | PRN

## 2021-09-27 MED ORDER — OXYTOCIN-SODIUM CHLORIDE 30-0.9 UT/500ML-% IV SOLN: 2.5000 [IU]/h | INTRAVENOUS | Status: AC

## 2021-09-27 MED ORDER — HYDROMORPHONE HCL 1 MG/ML IJ SOLN: 0.2000 mg | INTRAMUSCULAR | Status: DC | PRN

## 2021-09-27 MED ORDER — MEDROXYPROGESTERONE ACETATE 150 MG/ML IM SUSP: 150.0000 mg | INTRAMUSCULAR | Status: DC | PRN

## 2021-09-27 MED ORDER — MEASLES, MUMPS & RUBELLA VAC IJ SOLR: 0.5000 mL | Freq: Once | INTRAMUSCULAR | Status: DC

## 2021-09-27 MED ORDER — GABAPENTIN 100 MG PO CAPS
200.0000 mg | ORAL_CAPSULE | Freq: Two times a day (BID) | ORAL | Status: DC
  Administered 2021-09-27 – 2021-09-28 (×2): 200 mg via ORAL
  Filled 2021-09-27 (×2): qty 2

## 2021-09-27 MED ORDER — STERILE WATER FOR IRRIGATION IR SOLN
Status: DC | PRN
  Administered 2021-09-27: 1000 mL

## 2021-09-27 MED ORDER — DIBUCAINE (PERIANAL) 1 % EX OINT: 1.0000 "application " | TOPICAL_OINTMENT | CUTANEOUS | Status: DC | PRN

## 2021-09-27 MED ORDER — LACTATED RINGERS IV SOLN: INTRAVENOUS | Status: DC

## 2021-09-27 MED ORDER — MORPHINE SULFATE (PF) 0.5 MG/ML IJ SOLN
INTRAMUSCULAR | Status: DC | PRN
  Administered 2021-09-27: 3 mg via EPIDURAL

## 2021-09-27 MED ORDER — PRENATAL MULTIVITAMIN CH: 1.0000 | ORAL_TABLET | Freq: Every day | ORAL | Status: DC

## 2021-09-27 MED ORDER — ACETAMINOPHEN 10 MG/ML IV SOLN
INTRAVENOUS | Status: AC
  Filled 2021-09-27: qty 100

## 2021-09-27 MED ORDER — ENOXAPARIN SODIUM 60 MG/0.6ML IJ SOSY
60.0000 mg | PREFILLED_SYRINGE | INTRAMUSCULAR | Status: DC
  Administered 2021-09-28 – 2021-09-29 (×2): 60 mg via SUBCUTANEOUS
  Filled 2021-09-27 (×2): qty 0.6

## 2021-09-27 MED ORDER — CEFAZOLIN IN SODIUM CHLORIDE 3-0.9 GM/100ML-% IV SOLN
INTRAVENOUS | Status: AC
  Filled 2021-09-27: qty 100

## 2021-09-27 MED ORDER — ONDANSETRON HCL 4 MG/2ML IJ SOLN: 4.0000 mg | Freq: Three times a day (TID) | INTRAMUSCULAR | Status: DC | PRN

## 2021-09-27 MED ORDER — OXYCODONE HCL 5 MG PO TABS: 5.0000 mg | ORAL_TABLET | ORAL | Status: DC | PRN

## 2021-09-27 MED ORDER — LIDOCAINE-EPINEPHRINE (PF) 2 %-1:200000 IJ SOLN
INTRAMUSCULAR | Status: DC | PRN
  Administered 2021-09-27: 5 mL via INTRADERMAL
  Administered 2021-09-27: 3 mL via INTRADERMAL
  Administered 2021-09-27: 2 mL via INTRADERMAL
  Administered 2021-09-27: 10 mL via INTRADERMAL

## 2021-09-27 MED ORDER — OXYTOCIN-SODIUM CHLORIDE 30-0.9 UT/500ML-% IV SOLN
INTRAVENOUS | Status: DC | PRN
  Administered 2021-09-27: 300 mL via INTRAVENOUS
  Administered 2021-09-27: 200 mL via INTRAVENOUS

## 2021-09-27 MED ORDER — COCONUT OIL OIL: 1.0000 "application " | TOPICAL_OIL | Status: DC | PRN

## 2021-09-27 MED ORDER — MAGNESIUM HYDROXIDE 400 MG/5ML PO SUSP: 30.0000 mL | ORAL | Status: DC | PRN

## 2021-09-27 MED ORDER — FENTANYL CITRATE (PF) 100 MCG/2ML IJ SOLN
INTRAMUSCULAR | Status: DC | PRN
  Administered 2021-09-27: 11:00:00 50 ug via INTRAVENOUS
  Administered 2021-09-27: 11:00:00 100 ug via INTRAVENOUS

## 2021-09-27 MED ORDER — DIPHENHYDRAMINE HCL 25 MG PO CAPS: 25.0000 mg | ORAL_CAPSULE | ORAL | Status: DC | PRN

## 2021-09-27 MED ORDER — NIFEDIPINE ER OSMOTIC RELEASE 30 MG PO TB24
30.0000 mg | ORAL_TABLET | Freq: Every day | ORAL | Status: DC
  Administered 2021-09-27 – 2021-09-28 (×2): 30 mg via ORAL
  Filled 2021-09-27 (×2): qty 1

## 2021-09-27 MED ORDER — EPHEDRINE 5 MG/ML INJ: 10.0000 mg | INTRAVENOUS | Status: DC | PRN

## 2021-09-27 MED ORDER — PHENYLEPHRINE 40 MCG/ML (10ML) SYRINGE FOR IV PUSH (FOR BLOOD PRESSURE SUPPORT)
PREFILLED_SYRINGE | INTRAVENOUS | Status: DC | PRN
  Administered 2021-09-27 (×3): 80 ug via INTRAVENOUS

## 2021-09-27 MED ORDER — SUCCINYLCHOLINE CHLORIDE 200 MG/10ML IV SOSY
PREFILLED_SYRINGE | INTRAVENOUS | Status: AC
  Filled 2021-09-27: qty 10

## 2021-09-27 MED ORDER — SIMETHICONE 80 MG PO CHEW
80.0000 mg | CHEWABLE_TABLET | Freq: Three times a day (TID) | ORAL | Status: DC
  Administered 2021-09-27 – 2021-09-29 (×4): 80 mg via ORAL
  Filled 2021-09-27 (×4): qty 1

## 2021-09-27 MED ORDER — WITCH HAZEL-GLYCERIN EX PADS: 1.0000 "application " | MEDICATED_PAD | CUTANEOUS | Status: DC | PRN

## 2021-09-27 MED ORDER — SODIUM CHLORIDE 0.9 % IR SOLN
Status: DC | PRN
  Administered 2021-09-27: 1000 mL

## 2021-09-27 MED ORDER — FENTANYL CITRATE (PF) 100 MCG/2ML IJ SOLN
25.0000 ug | INTRAMUSCULAR | Status: DC | PRN
  Administered 2021-09-27: 13:00:00 50 ug via INTRAVENOUS

## 2021-09-27 MED ORDER — FENTANYL CITRATE (PF) 100 MCG/2ML IJ SOLN
INTRAMUSCULAR | Status: DC | PRN
  Administered 2021-09-27: 100 ug via EPIDURAL

## 2021-09-27 MED ORDER — LACTATED RINGERS IV SOLN: INTRAVENOUS | Status: DC | PRN

## 2021-09-27 MED ORDER — SUCCINYLCHOLINE 20MG/ML (10ML) SYRINGE FOR MEDFUSION PUMP - OPTIME
INTRAMUSCULAR | Status: DC | PRN
  Administered 2021-09-27: 180 mg via INTRAVENOUS

## 2021-09-27 MED ORDER — PROPOFOL 10 MG/ML IV BOLUS
INTRAVENOUS | Status: DC | PRN
  Administered 2021-09-27: 200 mg via INTRAVENOUS

## 2021-09-27 MED ORDER — ACETAMINOPHEN 10 MG/ML IV SOLN
1000.0000 mg | Freq: Four times a day (QID) | INTRAVENOUS | Status: AC
  Administered 2021-09-27 – 2021-09-28 (×2): 1000 mg via INTRAVENOUS
  Filled 2021-09-27 (×2): qty 100

## 2021-09-27 MED ORDER — MIDAZOLAM HCL 2 MG/2ML IJ SOLN
INTRAMUSCULAR | Status: AC
  Filled 2021-09-27: qty 2

## 2021-09-27 MED ORDER — FUROSEMIDE 20 MG PO TABS
20.0000 mg | ORAL_TABLET | Freq: Every day | ORAL | Status: DC
  Administered 2021-09-28 – 2021-09-29 (×2): 20 mg via ORAL
  Filled 2021-09-27 (×2): qty 1

## 2021-09-27 SURGICAL SUPPLY — 30 items
APL SKNCLS STERI-STRIP NONHPOA (GAUZE/BANDAGES/DRESSINGS) ×1
BENZOIN TINCTURE PRP APPL 2/3 (GAUZE/BANDAGES/DRESSINGS) ×3 IMPLANT
CANISTER PREVENA PLUS 150 (CANNISTER) ×1 IMPLANT
CHLORAPREP W/TINT 26ML (MISCELLANEOUS) ×3 IMPLANT
CLOTH BEACON ORANGE TIMEOUT ST (SAFETY) ×3 IMPLANT
DRESSING PREVENA PLUS CUSTOM (GAUZE/BANDAGES/DRESSINGS) IMPLANT
DRSG OPSITE POSTOP 4X10 (GAUZE/BANDAGES/DRESSINGS) ×3 IMPLANT
DRSG PREVENA PLUS CUSTOM (GAUZE/BANDAGES/DRESSINGS) ×2
ELECT REM PT RETURN 9FT ADLT (ELECTROSURGICAL) ×2
ELECTRODE REM PT RTRN 9FT ADLT (ELECTROSURGICAL) ×2 IMPLANT
GLOVE BIOGEL PI IND STRL 7.0 (GLOVE) ×6 IMPLANT
GLOVE BIOGEL PI INDICATOR 7.0 (GLOVE) ×3
GLOVE ECLIPSE 6.5 STRL STRAW (GLOVE) ×3 IMPLANT
GOWN STRL REUS W/ TWL LRG LVL3 (GOWN DISPOSABLE) ×4 IMPLANT
GOWN STRL REUS W/TWL LRG LVL3 (GOWN DISPOSABLE) ×4
KIT PREVENA INCISION MGT20CM45 (CANNISTER) ×1 IMPLANT
NS IRRIG 1000ML POUR BTL (IV SOLUTION) ×3 IMPLANT
PAD OB MATERNITY 4.3X12.25 (PERSONAL CARE ITEMS) ×3 IMPLANT
PAD PREP 24X48 CUFFED NSTRL (MISCELLANEOUS) ×3 IMPLANT
RETRACTOR WND ALEXIS 25 LRG (MISCELLANEOUS) IMPLANT
RTRCTR WOUND ALEXIS 25CM LRG (MISCELLANEOUS)
STRIP CLOSURE SKIN 1/2X4 (GAUZE/BANDAGES/DRESSINGS) ×3 IMPLANT
SUT PLAIN 2 0 XLH (SUTURE) ×3 IMPLANT
SUT VIC AB 0 CT1 36 (SUTURE) ×6 IMPLANT
SUT VIC AB 2-0 CT1 27 (SUTURE) ×2
SUT VIC AB 2-0 CT1 TAPERPNT 27 (SUTURE) ×2 IMPLANT
SUT VIC AB 4-0 KS 27 (SUTURE) ×3 IMPLANT
TOWEL OR 17X24 6PK STRL BLUE (TOWEL DISPOSABLE) ×9 IMPLANT
TRAY FOLEY CATH SILVER 16FR (SET/KITS/TRAYS/PACK) ×3 IMPLANT
WATER STERILE IRR 1000ML POUR (IV SOLUTION) ×3 IMPLANT

## 2021-09-27 NOTE — Lactation Note (Signed)
This note was copied from a baby's chart. Lactation Consultation Note  Patient Name: Phyllis Christian [id number] Date: 09/27/2021 Reason for consult: Initial assessment;Primapara;1st time breastfeeding;NICU baby Age:20 hours  Lactation followed up with [name]. I conducted basic breast feeding education and set up a DEBP. I showed her how to hand express and helped her to initiate pumping. I recommended that she pump q3 hours or 8 times a day. [name] had positive breast changes in pregnancy and reports having a personal pump at home. She intends to breast feed, and would like latch assistance when baby Roselee Nova is ready.  Maternal Data Has patient been taught Hand Expression?: Yes Does the patient have breastfeeding experience prior to this delivery?: No  Lactation Tools Discussed/Used Tools: Pump Breast pump type: Double-Electric Breast Pump Pump Education: Setup, frequency, and cleaning Reason for Pumping: NICU Pumped volume: 0 mL  Interventions Interventions: Breast feeding basics reviewed;Education  Discharge Pump: Personal;DEBP  Consult Status Consult Status: Follow-up Date: 09/27/21 Follow-up type: In-patient    Lenore Manner 09/27/2021, 3:55 PM

## 2021-09-27 NOTE — Anesthesia Preprocedure Evaluation (Signed)
Anesthesia Evaluation  Patient identified by MRN, date of birth, ID band Patient awake    Reviewed: Allergy & Precautions, NPO status , Patient's Chart, lab work & pertinent test results  Airway Mallampati: III  TM Distance: >3 FB Neck ROM: Full    Dental no notable dental hx.    Pulmonary asthma , sleep apnea ,    Pulmonary exam normal breath sounds clear to auscultation       Cardiovascular hypertension (gHTN), Normal cardiovascular exam Rhythm:Regular Rate:Normal     Neuro/Psych negative neurological ROS  negative psych ROS   GI/Hepatic negative GI ROS, Neg liver ROS,   Endo/Other  Morbid obesity (BMI 44)  Renal/GU negative Renal ROS  negative genitourinary   Musculoskeletal negative musculoskeletal ROS (+)   Abdominal   Peds  Hematology negative hematology ROS (+) Sickle cell trait ,   Anesthesia Other Findings IOL for gHTN  Reproductive/Obstetrics (+) Pregnancy                             Anesthesia Physical Anesthesia Plan  ASA: 3  Anesthesia Plan: Epidural   Post-op Pain Management:    Induction:   PONV Risk Score and Plan: Treatment may vary due to age or medical condition  Airway Management Planned: Natural Airway  Additional Equipment:   Intra-op Plan:   Post-operative Plan:   Informed Consent: I have reviewed the patients History and Physical, chart, labs and discussed the procedure including the risks, benefits and alternatives for the proposed anesthesia with the patient or authorized representative who has indicated his/her understanding and acceptance.       Plan Discussed with: Anesthesiologist  Anesthesia Plan Comments: (Patient identified. Risks, benefits, options discussed with patient including but not limited to bleeding, infection, nerve damage, paralysis, failed block, incomplete pain control, headache, blood pressure changes, nausea, vomiting,  reactions to medication, itching, and post partum back pain. Confirmed with bedside nurse the patient's most recent platelet count. Confirmed with the patient that they are not taking any anticoagulation, have any bleeding history or any family history of bleeding disorders. Patient expressed understanding and wishes to proceed. All questions were answered. )        Anesthesia Quick Evaluation

## 2021-09-27 NOTE — Progress Notes (Signed)
Patient ID: Phyllis Christian, female   DOB: [birth year], 20 y.o.   MRN: [id number]  In to tell pt goodbye- she and family appear to be sleeping; Pit still currently off  BPs 131/68, 147/82, 157/86 (since larger cuff) FHR 140s, min LTV, approx 2 variables q 10 mins Ctx difficult to trace Cx deferred  IUP@39 .5wks gHTN IOL process FHR variables  May plan to try AROM later on  Myrtis Ser [facility] 09/27/2021 7:35 AM

## 2021-09-27 NOTE — Progress Notes (Signed)
Patient ID: Phyllis Christian, female   DOB: [birth year], 20 y.o.   MRN: [id number]  Cervical foley still in place x 13h; had some severe range BPs tx with IV Lab/hydral and also had BP cuff change; has had albuterol neb tx with good effect  BPs 138/83, 151/70 FHR 140s, some 10 x 10 accels, min LTV, freq v-shaped variables Ctx difficulty to trace Cx: feels approx 2cm around balloon  IUP@39 .5wks gHTN Asthma IOL process FHR variables  -Will start 1 x 1 Pit to encourage foley to come out -If BPs get to severe range again will start mag sulfate and change dx to pre-e (unsure if values were higher due to small cuff) -Guarded for vag delivery  Myrtis Ser CNM 09/27/2021 2:15 AM

## 2021-09-27 NOTE — Anesthesia Procedure Notes (Signed)
Epidural Patient location during procedure: OB Start time: 09/27/2021 8:15 AM End time: 09/27/2021 8:25 AM  Staffing Anesthesiologist: Freddrick March, MD Performed: anesthesiologist   Preanesthetic Checklist Completed: patient identified, IV checked, risks and benefits discussed, monitors and equipment checked, pre-op evaluation and timeout performed  Epidural Patient position: sitting Prep: DuraPrep and site prepped and draped Patient monitoring: continuous pulse ox, blood pressure, heart rate and cardiac monitor Approach: midline Location: L3-L4 Injection technique: LOR air  Needle:  Needle type: Tuohy  Needle gauge: 17 G Needle length: 9 cm Needle insertion depth: 8 cm Catheter type: closed end flexible Catheter size: 19 Gauge Catheter at skin depth: 15 cm Test dose: negative  Assessment Sensory level: T8 Events: blood not aspirated, injection not painful, no injection resistance, no paresthesia and negative IV test  Additional Notes Patient identified. Risks/Benefits/Options discussed with patient including but not limited to bleeding, infection, nerve damage, paralysis, failed block, incomplete pain control, headache, blood pressure changes, nausea, vomiting, reactions to medication both or allergic, itching and postpartum back pain. Confirmed with bedside nurse the patient's most recent platelet count. Confirmed with patient that they are not currently taking any anticoagulation, have any bleeding history or any family history of bleeding disorders. Patient expressed understanding and wished to proceed. All questions were answered. Sterile technique was used throughout the entire procedure. Please see nursing notes for vital signs. Test dose was given through epidural catheter and negative prior to continuing to dose epidural or start infusion. Warning signs of high block given to the patient including shortness of breath, tingling/numbness in hands, complete motor block,  or any concerning symptoms with instructions to call for help. Patient was given instructions on fall risk and not to get out of bed. All questions and concerns addressed with instructions to call with any issues or inadequate analgesia.  Reason for block:procedure for pain

## 2021-09-27 NOTE — Progress Notes (Signed)
Interim Progress Note   Called by RN to evaluated patient at bedside. She was given her procardia pill around 1700 and then immediately felt it get stuck in her distal esophagus. Went to bedside.   Reports she is having a pressure sensation in her upper/mid chest whenever she swallows. Can not keep down her secretions, spitting up her saliva after swallowing. They have already tried sipping water, hot tea, and soda with different positions and has not relieved the sensation. She also vomited x1 without relief. She is breathing comfortably, denies any difficulty breathing. She is hemodynamically stable.   States this has happened before back in 2016 after she ate a piece of chicken. Presented to the ED and resolved with IV glucagon/fluids. She also intermittently has difficulty with swallowing pills (get stuck), however usually goes down after drinking a few sips of fluid.   It has now been approximately 2 hours without improvement. Spoke with GI, Dr. Lorenso Courier, who recommended trying IV glucagon 1mg  and if no success in 30 minutes, to try again. If no relief, can discuss scope. Additionally discussed that patient should consider follow up with GI outpatient for evaluation given her history.   IV glucagon ordered. Will monitor closely. Transitioned her tylenol to IV to allow continued pain control and esophagus rest as she is POD#0 from a primary C/S.    UPDATE:  Pill passed s/p IV glucagon. Feeling much better. Will ensure she follows up with GI outpatient.   Patriciaann Clan, DO

## 2021-09-27 NOTE — Progress Notes (Signed)
Late documentation.  Evaluated patient at bedside. FHT with periods of minimal to moderate variability and recurrent variables (occasional deep) despite without augmentation (pit discontinued at 0230). Recently had her epidural and now comfortable, as did not tolerate attempt to ROM earlier this morning. Discussed plan with patient to perform AROM and place internal monitors.   Cervical check 4/40/-2. Head well applied. After verbal consent, performed AROM with moderate meconium stained and blood tinged fluid. IUPC placed with blood tinged fluid return. FSE placed. During this time, noted fetal deceleration into the 80's. This persisted despite position changes. Rechecked cervix without cord prolapse. Dr. Ernestina Patches at bedside, discussed proceeding with C/S. At 7 minutes, HR returned to 120's. Called for urgent C/S, which later transitioned to STAT as HR decreased while in the OR.   Patriciaann Clan, DO

## 2021-09-27 NOTE — Op Note (Signed)
Phyllis Christian PROCEDURE DATE: 09/27/2021  PREOPERATIVE DIAGNOSES: Intrauterine pregnancy at [redacted]w[redacted]d weeks gestation; non-reassuring fetal status and Prolonged fetal bradycardia  POSTOPERATIVE DIAGNOSES: The same  PROCEDURE: Primary Low Transverse Cesarean Section  SURGEON:   Lauretta Chester, MD Patriciaann Clan, DO    ANESTHESIOLOGY TEAM: Anesthesiologist: Freddrick March, MD CRNA: Bufford Spikes, CRNA; Asher Muir, CRNA  INDICATIONS: Phyllis Christian is a 20 y.o. G1P0 at [redacted]w[redacted]d here for cesarean section secondary to fetal bradycardia and non-reassuring FHT prior to without augmentation. Initially proceeded with urgent as HR had returned to 120's, however when arrived into the OR FHR was back into the 60's, thus called for STAT. The risks of cesarean section were discussed with the patient including but were not limited to: bleeding which may require transfusion or reoperation; infection which may require antibiotics; injury to bowel, bladder, ureters or other surrounding organs; injury to the fetus; need for additional procedures including hysterectomy in the event of a life-threatening hemorrhage; placental abnormalities wth subsequent pregnancies, incisional problems, thromboembolic phenomenon and other postoperative/anesthesia complications.   The patient concurred with the proposed plan, giving informed written consent for the procedure.    FINDINGS:  Viable female infant in cephalic presentation.   APGAR (1 MIN): 7   APGAR (5 MINS): 8   APGAR (10 MINS): 9   Moderate meconium amniotic fluid.  Intact small placenta, three vessel cord.  Normal uterus, fallopian tubes and ovaries bilaterally.  ANESTHESIA: Epidural, general  INTRAVENOUS FLUIDS: 1,000 ml   ESTIMATED BLOOD LOSS: 838 ml URINE OUTPUT:  275 ml SPECIMENS: Placenta sent to pathology COMPLICATIONS: None immediate   PROCEDURE IN DETAIL:  The patient preoperatively received intravenous antibiotics and had sequential compression  devices applied to her lower extremities.  She was then taken to the operating room where the epidural anesthesia was found to be adequate. She was then placed in a dorsal supine position with a leftward tilt, and prepped and draped in a sterile manner.  A foley catheter was placed into her bladder and attached to constant gravity.  After an adequate timeout was performed, a Pfannenstiel skin incision was made with scalpel and carried through to the underlying layer of fascia. The fascia was incised in the midline, and this incision was extended bilaterally bluntly. The rectus muscles were separated in the midline and the peritoneum was entered bluntly. A rich and a bladder blade were placed. Attention was turned to the lower uterine segment where a low transverse hysterotomy was made with a scalpel and extended bilaterally bluntly.  The infant was successfully delivered with spontaneous cry, the cord was clamped and cut shortly after, and the infant was handed over to the awaiting neonatology team. Cord PH 6.9.  The patient entered the OR at 1017. FHR found to be in the 60's and transitioned to STAT C-section. Skin incision at 1021. Patient reported feeling the entire procedure, converted to general anesthesia after skin incision and paused until adequate. Infant was delivered at 1024.   Uterine massage was then administered, and the placenta delivered intact with a three-vessel cord. The uterus was then cleared of clots and debris.  The hysterotomy was closed with 0 Vicryl in a running locked fashion, and an imbricating layer was also placed with 0 Vicryl. The pelvis was cleared of all clot and debris. Hemostasis was confirmed on all surfaces. The peritoneum was closed with a 0 Vicryl running stitch. The fascia was then closed using 0 Vicryl PDS in a running fashion.  The  subcutaneous layer was irrigated, re-approximated with 2-0 plain gut interrupted stitches, and the skin was closed with a 4-0 Vicryl  subcuticular stitch. The patient tolerated the procedure well. Sponge, instrument and needle counts were correct x 3.  She was taken to the recovery room in stable condition.   [place] [facility] [employer]

## 2021-09-27 NOTE — Discharge Summary (Signed)
Postpartum Discharge Summary  Date of Service updated 09/29/2021     Patient Name: Phyllis Christian DOB: Feb 14, 2001 MRN: [id number]  Date of admission: 09/26/2021 Delivery date:09/27/2021  Delivering provider: Caren Macadam  Date of discharge: 09/29/2021  Admitting diagnosis: Gestational hypertension [O13.9] Intrauterine pregnancy: [redacted]w[redacted]d    Secondary diagnosis:  Principal Problem:   Gestational hypertension Active Problems:   Sickle cell trait (HCC)   Mild intermittent asthma without complication   Maternal obesity affecting pregnancy, antepartum  Additional problems: difficulty swallowing pill   Discharge diagnosis: Term Pregnancy Delivered and Gestational Hypertension                                              Post partum procedures: no Augmentation: AROM, Pitocin, and IP Foley Complications: None  Hospital course: Induction of Labor With Cesarean Section   20y.o. yo G1P1001 at 335w5das admitted to the hospital 09/26/2021 for induction of labor. Patient had a labor course significant for induction/augmentation with FB/pit with Cat II tracing. Proceed with AROM, however then experience persistent fetal bradycardia (no cord prolapse present) and went for STAT C/S. The patient went for cesarean section due to Non-Reassuring FHR. Delivery details are as follows: Membrane Rupture Time/Date: 10:05 AM ,09/27/2021   Delivery Method:C-Section, Low Transverse  Details of operation can be found in separate operative Note.  Patient had an uncomplicated postpartum course. She is ambulating, tolerating a regular diet, passing flatus, and urinating well.  Patient is discharged home in stable condition on 09/29/21.      Newborn Data: Birth date:09/27/2021  Birth time:10:24 AM  Gender:Female  Living status:Living  Apgars:7 ,8  Weight:2340 g                                Magnesium Sulfate received: No BMZ received: No Rhophylac:No MMR:No T-DaP:Given prenatally Flu:  No Transfusion:No  Physical exam  Vitals:   09/28/21 0339 09/28/21 1013 09/28/21 2249 09/29/21 0549  BP: 140/69 140/80 136/75 134/80  Pulse: 79 77 90 (!) 102  Resp: '18  18 18  ' Temp: 98.3 F (36.8 C)  98.5 F (36.9 C) 99.6 F (37.6 C)  TempSrc: Oral  Oral Oral  SpO2: 99%  99% 97%  Weight:      Height:       General: alert, cooperative, and no distress Lochia: appropriate Uterine Fundus: firm Incision: Dressing is clean, dry, and intact DVT Evaluation: No evidence of DVT seen on physical exam. Labs: Lab Results  Component Value Date   WBC 22.5 (H) 09/28/2021   HGB 8.9 (L) 09/28/2021   HCT 27.9 (L) 09/28/2021   MCV 80.6 09/28/2021   PLT 244 09/28/2021   CMP Latest Ref Rng & Units 09/26/2021  Glucose 70 - 99 mg/dL 74  BUN 6 - 20 mg/dL 7  Creatinine 0.44 - 1.00 mg/dL 0.79  Sodium 135 - 145 mmol/L 134(L)  Potassium 3.5 - 5.1 mmol/L 4.2  Chloride 98 - 111 mmol/L 107  CO2 22 - 32 mmol/L 19(L)  Calcium 8.9 - 10.3 mg/dL 8.1(L)  Total Protein 6.5 - 8.1 g/dL 5.8(L)  Total Bilirubin 0.3 - 1.2 mg/dL 0.4  Alkaline Phos 38 - 126 U/L 181(H)  AST 15 - 41 U/L 12(L)  ALT 0 - 44 U/L 11   Edinburgh Score:  No flowsheet data found.   After visit meds:  Allergies as of 09/29/2021       Reactions   Codeine Hives   Motrin [ibuprofen] Hives        Medication List     STOP taking these medications    ascorbic acid 500 MG tablet Commonly known as: VITAMIN C       TAKE these medications    acetaminophen 500 MG tablet Commonly known as: TYLENOL Take 500 mg by mouth every 8 (eight) hours as needed for mild pain.   albuterol 108 (90 Base) MCG/ACT inhaler Commonly known as: VENTOLIN HFA Inhale 2 puffs into the lungs every 4 (four) hours as needed for wheezing (or cough). Shake well; always use spacer.   albuterol (2.5 MG/3ML) 0.083% nebulizer solution Commonly known as: PROVENTIL Take 3 mLs (2.5 mg total) by nebulization every 4 (four) hours as needed for wheezing or  shortness of breath.   amLODipine 10 MG tablet Commonly known as: NORVASC Take 1 tablet (10 mg total) by mouth daily. Start taking on: September 30, 2021   furosemide 20 MG tablet Commonly known as: LASIX Take 1 tablet (20 mg total) by mouth daily.   Iron Polysacch Cmplx-B12-FA 150-0.025-1 MG Caps Take 1 capsule by mouth every other day.   oxyCODONE 5 MG/5ML solution Commonly known as: ROXICODONE Take 5 mLs (5 mg total) by mouth every 4 (four) hours as needed for severe pain.   Prenatal Vitamin 27-0.8 MG Tabs Take 1 tablet by mouth daily.         Discharge home in stable condition Infant Feeding: Breast Infant Disposition: [facility] Discharge instruction: per After Visit Summary and Postpartum booklet. Activity: Advance as tolerated. Pelvic rest for 6 weeks.  Diet: routine diet Future Appointments:No future appointments. Follow up Visit:  [place] Follow up in 1 week(s).   Specialty: Obstetrics and Gynecology Why: For wound re-check Contact information: [address] [place] [phone]                Message sent to [facility] by Dr Higinio Plan:  Please schedule this patient for a In person postpartum visit in 6 weeks with the following provider: Any provider. Additional Postpartum F/U:Incision check 1 week and BP check 1 week  High risk pregnancy complicated by: HTN Delivery mode:  C-Section, Low Transverse  Anticipated Birth Control:  POPs   09/29/2021 Emeterio Reeve, MD

## 2021-09-27 NOTE — Transfer of Care (Signed)
Immediate Anesthesia Transfer of Care Note  Patient: Phyllis Christian  Procedure(s) Performed: CESAREAN SECTION (Abdomen)  Patient Location: PACU  Anesthesia Type:General  Level of Consciousness: sedated  Airway & Oxygen Therapy: Patient Spontanous Breathing and Patient connected to nasal cannula oxygen  Post-op Assessment: Report given to RN  Post vital signs: Reviewed and stable  Last Vitals:  Vitals Value Taken Time  BP 115/57 09/27/21 1134  Temp    Pulse 83 09/27/21 1140  Resp 19 09/27/21 1140  SpO2 97 % 09/27/21 1140  Vitals shown include unvalidated device data.  Last Pain:  Vitals:   09/27/21 1000  TempSrc:   PainSc: Asleep      Patients Stated Pain Goal: 0 (08/09/14 9458)  Complications: No notable events documented.

## 2021-09-27 NOTE — Anesthesia Postprocedure Evaluation (Signed)
Anesthesia Post Note  Patient: Phyllis Christian  Procedure(s) Performed: CESAREAN SECTION (Abdomen)     Patient location during evaluation: PACU Anesthesia Type: Epidural and General Level of consciousness: awake and alert Pain management: pain level controlled Vital Signs Assessment: post-procedure vital signs reviewed and stable Respiratory status: spontaneous breathing, nonlabored ventilation, respiratory function stable and patient connected to nasal cannula oxygen Cardiovascular status: blood pressure returned to baseline and stable Postop Assessment: no apparent nausea or vomiting Anesthetic complications: no   No notable events documented.  Last Vitals:  Vitals:   09/27/21 1245 09/27/21 1350  BP: 136/86 (!) 155/96  Pulse: 65 96  Resp: 20 20  Temp: 36.6 C 36.9 C  SpO2: 99% 99%    Last Pain:  Vitals:   09/27/21 1350  TempSrc: Oral  PainSc:    Pain Goal: Patients Stated Pain Goal: 0 (09/26/21 1624)                 [place]

## 2021-09-27 NOTE — Anesthesia Procedure Notes (Addendum)
Procedure Name: Intubation Date/Time: 09/27/2021 10:23 AM Performed by: Asher Muir, CRNA Pre-anesthesia Checklist: Patient identified, Emergency Drugs available, Suction available and Patient being monitored Patient Re-evaluated:Patient Re-evaluated prior to induction Oxygen Delivery Method: Circle system utilized Preoxygenation: Pre-oxygenation with 100% oxygen Induction Type: IV induction and Cricoid Pressure applied Ventilation: Mask ventilation without difficulty Laryngoscope Size: Mac, 3 and Glidescope Grade View: Grade I Tube size: 7.0 mm Number of attempts: 1 Placement Confirmation: ETT inserted through vocal cords under direct vision, positive ETCO2 and breath sounds checked- equal and bilateral Secured at: 21 cm Tube secured with: Tape Dental Injury: Teeth and Oropharynx as per pre-operative assessment

## 2021-09-27 NOTE — Progress Notes (Signed)
Called to the room for fetal bradycardia after ROM  The risks of cesarean section were discussed with the patient including but were not limited to: bleeding which may require transfusion or reoperation; infection which may require antibiotics; injury to bowel, bladder, ureters or other surrounding organs; injury to the fetus; need for additional procedures including hysterectomy in the event of a life-threatening hemorrhage; placental abnormalities wth subsequent pregnancies, incisional problems, thromboembolic phenomenon and other postoperative/anesthesia complications.  The patient concurred with the proposed plan, giving informed written consent for the procedures. She will remain NPO for procedure. Anesthesia and OR aware.  Preoperative prophylactic antibiotics and SCDs ordered on call to the OR.  To OR when ready.  Caren Macadam, MD, MPH, ABFM, [facility] Attending Physician Center for [facility]

## 2021-09-27 NOTE — Progress Notes (Signed)
Patient ID: Phyllis Christian, female   DOB: 08-12-2001, 20 y.o.   MRN: [id number]  CTSP due to more frequent FHR variables since Pitocin has been at 58mu/min. Spoke w Dr Rip Harbour who reviewed tracing and recommended removing cervical foley and the AROM. Foley deflated and removed (80cc balloon); cx 3/60/vtx -3. Unable to AROM due to pt discomfort and high vtx. Dr Rip Harbour recommended stopping Pitocin for now and then reevaluating. Pt agreeable. FHR 140s now with min LTV and about 2-3 variables q 27mins.  Myrtis Ser CNM 09/27/2021 4:53 AM

## 2021-09-28 LAB — CBC
HCT: 27.9 % — ABNORMAL LOW (ref 36.0–46.0)
Hemoglobin: 8.9 g/dL — ABNORMAL LOW (ref 12.0–15.0)
MCH: 25.7 pg — ABNORMAL LOW (ref 26.0–34.0)
MCHC: 31.9 g/dL (ref 30.0–36.0)
MCV: 80.6 fL (ref 80.0–100.0)
Platelets: 244 10*3/uL (ref 150–400)
RBC: 3.46 MIL/uL — ABNORMAL LOW (ref 3.87–5.11)
RDW: 16.2 % — ABNORMAL HIGH (ref 11.5–15.5)
WBC: 22.5 10*3/uL — ABNORMAL HIGH (ref 4.0–10.5)
nRBC: 0.1 % (ref 0.0–0.2)

## 2021-09-28 MED ORDER — OXYCODONE HCL 5 MG/5ML PO SOLN
5.0000 mg | ORAL | Status: DC | PRN
  Administered 2021-09-28: 23:00:00 5 mg via ORAL
  Filled 2021-09-28: qty 5

## 2021-09-28 MED ORDER — POLYETHYLENE GLYCOL 3350 17 G PO PACK: 17.0000 g | PACK | Freq: Every day | ORAL | Status: DC | PRN

## 2021-09-28 MED ORDER — SODIUM CHLORIDE 0.9 % IV SOLN
500.0000 mg | Freq: Once | INTRAVENOUS | Status: AC
  Administered 2021-09-28: 09:00:00 500 mg via INTRAVENOUS
  Filled 2021-09-28: qty 25

## 2021-09-28 MED ORDER — GLUCAGON HCL RDNA (DIAGNOSTIC) 1 MG IJ SOLR: 1.0000 mg | Freq: Once | INTRAMUSCULAR | Status: DC

## 2021-09-28 MED ORDER — ACETAMINOPHEN 160 MG/5ML PO SOLN
650.0000 mg | Freq: Four times a day (QID) | ORAL | Status: DC | PRN
  Administered 2021-09-29: 06:00:00 650 mg via ORAL
  Filled 2021-09-28: qty 20.3

## 2021-09-28 MED ORDER — AMLODIPINE 1 MG/ML ORAL SUSPENSION
10.0000 mg | Freq: Every day | ORAL | Status: DC
  Filled 2021-09-28: qty 10

## 2021-09-28 NOTE — Progress Notes (Signed)
POSTPARTUM PROGRESS NOTE  Subjective: Phyllis Christian is a 20 y.o. G1P1001 POD#1 s/p pLTCS at [redacted]w[redacted]d.  She reports she doing well. No acute events overnight. She denies any problems with ambulating, voiding or po intake. Denies nausea or vomiting. She has  passed flatus. Pain is well controlled.  Lochia is scant.  Objective: Blood pressure 140/69, pulse 79, temperature 98.3 F (36.8 C), temperature source Oral, resp. rate 18, height 5\' 6"  (1.676 m), weight 124.8 kg, last menstrual period 12/03/2020, SpO2 99 %, unknown if currently breastfeeding.  Physical Exam:  General: alert, cooperative and no distress Chest: no respiratory distress Abdomen: soft, non-tender   dressing in place and intact  Uterine Fundus: firm, appropriately tender Extremities: No calf swelling or tenderness   no edema  Recent Labs    09/26/21 1229 09/27/21 0740  HGB 10.4* 10.2*  HCT 31.8* 30.8*    Assessment/Plan: Phyllis Christian is a 20 y.o. G1P1001 POD#1 s/p pLTCS at [redacted]w[redacted]d.  POD#1: Doing well, pain well-controlled. H/H appropriate.  -- Routine postpartum care -- Encouraged up OOB -- Lovenox for VTE prophylaxis  Routine Postpartum Care -- Contraception: POPs -- Feeding: breast  #gHTN BP in 140s/60s-80s. On Procardia 30mg .  - continue Procardia 30mg   #S/p Esophageal pill stuck Patient's procardia pill stuck in throat. Did not resolve with water, hote tea, and soda. Ultimately resolved with Glucagon.  - will monitor - Outpatient GI follow up for further evaluation  #Acute blood loss anemia HgB 10.2>8.9 after EBL of 838. -IV Venofer  Dispo: Plan for discharge POD 2 or 3 depending on clinical status and patient preference.  Renard Matter, MD, MPH OB Fellow, [facility] for [facility]

## 2021-09-28 NOTE — Lactation Note (Deleted)
This note was copied from a baby's chart.  NICU Lactation Consultation Note  Patient Name: Phyllis Christian [id number] Date: 09/28/2021 Age:20 hours   Subjective Reason for consult: Follow-up assessment; Maternal discharge Mother has not pumped today. She notices her breasts feel fuller today but she denies discomfort.  We reviewed s/s of engorgement and I encouraged her to begin pumping q3.   Objective Infant data: Mother's Current Feeding Choice: Breast Milk  Infant feeding assessment Scale for Readiness: 5 (intermitten tachypnea)    Maternal data: G1P1001  C-Section, Low Transverse Significant Breast History:: breast growth in pregnancy  Current breast feeding challenges:: NICU  Does the patient have breastfeeding experience prior to this delivery?: No  Pumping frequency: no pumping in past 12+ hours Pumped volume: 0 mL  Risk factor for low milk supply:: infrequent pumping   Pump: Personal, DEBP  Assessment Maternal: At risk for engorgement and low milk supply because of infrequent pumping.    Intervention/Plan Interventions: Education  Tools: Pump Pump Education: Setup, frequency, and cleaning  Plan: Consult Status: Follow-up  NICU Follow-up type: Verify absence of engorgement; Verify onset of copious milk  Mother encouraged to pump q3  Gwynne Edinger 09/28/2021, 12:27 PM

## 2021-09-28 NOTE — Lactation Note (Signed)
This note was copied from a baby's chart.  NICU Lactation Consultation Note  Patient Name: Phyllis Christian [id number] Date: 09/28/2021 Age:20 hours   Subjective Reason for consult: Follow-up assessment Mother is pumping often and without difficulty. She is pleased with copious colostrum at 24+ hours.   Objective Infant data: Mother's Current Feeding Choice: Breast Milk  Infant feeding assessment Scale for Readiness: 5 (intermitten tachypnea)    Maternal data: G1P1001  C-Section, Low Transverse Significant Breast History:: breast growth in pregnancy  Current breast feeding challenges:: NICU  Does the patient have breastfeeding experience prior to this delivery?: No  Pumping frequency: q3 Pumped volume: 15 mL   Pump: Personal, DEBP  Assessment Maternal: Milk volume: Normal   Intervention/Plan Interventions: Education  Tools: Pump Pump Education: Setup, frequency, and cleaning  Plan: Consult Status: Follow-up  NICU Follow-up type: New admission follow up; Verify absence of engorgement; Verify onset of copious milk  Mother to take any EBM to NICU. Mother to continue pumping q3.   Gwynne Edinger 09/28/2021, 12:31 PM

## 2021-09-29 MED ORDER — AMLODIPINE BESYLATE 10 MG PO TABS
10.0000 mg | ORAL_TABLET | Freq: Every day | ORAL | Status: DC
  Administered 2021-09-29: 09:00:00 10 mg via ORAL
  Filled 2021-09-29: qty 1

## 2021-09-29 MED ORDER — AMLODIPINE BESYLATE 10 MG PO TABS
10.0000 mg | ORAL_TABLET | Freq: Every day | ORAL | 1 refills | Status: DC
Start: 2021-09-30 — End: 2022-05-15

## 2021-09-29 MED ORDER — OXYCODONE HCL 5 MG/5ML PO SOLN: 5.0000 mg | ORAL | 0 refills | Status: DC | PRN

## 2021-09-29 MED ORDER — FUROSEMIDE 20 MG PO TABS: 20.0000 mg | ORAL_TABLET | Freq: Every day | ORAL | 0 refills | Status: DC

## 2021-09-29 NOTE — Lactation Note (Signed)
Lactation Consultation Note  Patient Name: Phyllis Christian [id number] Date: 09/29/2021 Reason for consult: NICU baby;Other (Comment);Follow-up assessment;Maternal discharge;1st time breastfeeding;Primapara (baby was transferred to [facility] yesterday) Age:20 y.o.  Visited with mom of 61 hours old NICU female, she's a P1 and experiencing the onset of lactogenesis II. She reports pumping is going well and that she's planning to take all her breastmilk to [facility]. She doesn't anticipate for baby to come back to our NICU after [facility]'s discharge.  Mom is getting discharged from [facility] today. Reviewed engorgement prevention/treatment, sore nipples and breastmilk storage guidelines.   Maternal Data  Mom's supply is WNL  Feeding Mother's Current Feeding Choice: Breast Milk  Lactation Tools Discussed/Used Tools: Pump Breast pump type: Double-Electric Breast Pump Pump Education: Setup, frequency, and cleaning;Milk Storage Reason for Pumping: NICU infant Pumping frequency: 6 times/24 hours Pumped volume: 45 mL  Interventions Interventions: Breast feeding basics reviewed;DEBP;Education  Plan of care  Encouraged mom to continue pumping consistently every 3 hours, at least 8 pumping sessions/24 hours She'll F/U with NICU LC @ [facility] but she's aware that NICU LCs @ [facility] hospital are also available after her discharge  Visitor present. All questions and concerns answered, mom to contact NICU LC PRN.  Discharge Discharge Education: Engorgement and breast care Pump: DEBP;Personal (HoFish DEBP for home use)  Consult Status Consult Status: Complete   Tyrion Glaude S Andoni Busch 09/29/2021, 10:19 AM

## 2021-09-30 LAB — TYPE AND SCREEN
ABO/RH(D): A POS
Antibody Screen: NEGATIVE
Unit division: 0
Unit division: 0

## 2021-09-30 LAB — BPAM RBC
Blood Product Expiration Date: 202301092359
Blood Product Expiration Date: 202301122359
Unit Type and Rh: 5100
Unit Type and Rh: 5100

## 2021-10-02 ENCOUNTER — Telehealth: Payer: Self-pay

## 2021-10-02 LAB — SURGICAL PATHOLOGY

## 2021-10-02 NOTE — Telephone Encounter (Signed)
Transition Care Management Unsuccessful Follow-up Telephone Call  Date of discharge and from where:  09/29/2021 from [facility]  Attempts:  1st Attempt  Reason for unsuccessful TCM follow-up call:  Left voice message

## 2021-10-03 NOTE — Telephone Encounter (Signed)
Transition Care Management Follow-up Telephone Call Date of discharge and from where: 09/29/2021 from [facility] How have you been since you were released from the hospital? Pt stated that she is feeling better and did not have any questions or concerns at this time.  Any questions or concerns? No  Items Reviewed: Did the pt receive and understand the discharge instructions provided? Yes  Medications obtained and verified? Yes  Other? No  Any new allergies since your discharge? No  Dietary orders reviewed? No Do you have support at home? Yes   Functional Questionnaire: (I = Independent and D = Dependent) ADLs: I  Bathing/Dressing- I  Meal Prep- I  Eating- I  Maintaining continence- I  Transferring/Ambulation- I  Managing Meds- I  Follow up appointments reviewed:  PCP Hospital f/u appt confirmed? No   Specialist Hospital f/u appt confirmed? Yes  Scheduled to see Lynnda Shields, MD on 10/04/2021 @ 1:50pm. Are transportation arrangements needed? No  If their condition worsens, is the pt aware to call PCP or go to the Emergency Dept.? Yes Was the patient provided with contact information for the PCP's office or ED? Yes Was to pt encouraged to call back with questions or concerns? Yes

## 2021-10-04 ENCOUNTER — Ambulatory Visit (INDEPENDENT_AMBULATORY_CARE_PROVIDER_SITE_OTHER): Payer: Medicaid Other | Admitting: Obstetrics and Gynecology

## 2021-10-04 ENCOUNTER — Encounter: Payer: Self-pay | Admitting: Obstetrics and Gynecology

## 2021-10-04 ENCOUNTER — Other Ambulatory Visit: Payer: Self-pay

## 2021-10-04 DIAGNOSIS — Z4889 Encounter for other specified surgical aftercare: Secondary | ICD-10-CM | POA: Insufficient documentation

## 2021-10-04 NOTE — Progress Notes (Signed)
Pt is here for incision check and BP check. Pt states she has taken the furosemide. She has not started the amlodipine. Pt states she does have some HA but they do go away. She has not taken any tylenol to help. Denies any visual changes or floaters. Denies any SOB or chest pain.

## 2021-10-04 NOTE — Progress Notes (Signed)
°  CC: wound check/ BP check Subjective:    Patient ID: Phyllis Christian, female    DOB: 15-Jun-2001, 20 y.o.   MRN: [id number]  HPI Pt seen for wound check for primary LTCS on 09/27/21.  Pt had wound vac, but it was displaced a day or two ago.  Pt completed lasix, but did not take any of the assigned Norvasc.  She denies any headache or viusal changes.  Review of Systems     Objective:   Physical Exam Vitals:   10/04/21 1348  BP: (!) 146/95  Pulse: (!) 121   Wound vac bandage removed.   Wound clean dry and intact, no drainage or defect.  Small amount of residual adhesive noted.  Pt advised on how to remove.      Assessment & Plan:   1. Encounter for post surgical wound check Wound is stable BP elevated, pt will pick up medication and begin taking it as prescribed. BP check in 1 week.    Griffin Basil, MD Faculty Attending, Center for [facility]

## 2021-10-09 ENCOUNTER — Telehealth (HOSPITAL_COMMUNITY): Payer: Self-pay | Admitting: *Deleted

## 2021-10-09 NOTE — Telephone Encounter (Signed)
Hospital Discharge Follow-Up Call:  Patient reports that she is well and has no concerns about her healing process.  EPDS today was 2 and she endorses this accurately reflects that she is doing well emotionally.  Patient says that baby is currently in the NICU at [facility].

## 2021-10-10 ENCOUNTER — Encounter (HOSPITAL_COMMUNITY): Payer: Self-pay | Admitting: Family Medicine

## 2021-10-10 NOTE — Progress Notes (Signed)
Patient was assessed and managed by nursing staff during this encounter. I have reviewed the chart and agree with the documentation and plan. I have also made any necessary editorial changes.  Emeterio Reeve, MD 10/10/2021 10:47 AM

## 2021-10-11 ENCOUNTER — Ambulatory Visit: Payer: Medicaid Other

## 2021-10-12 ENCOUNTER — Ambulatory Visit (INDEPENDENT_AMBULATORY_CARE_PROVIDER_SITE_OTHER): Payer: Medicaid Other

## 2021-10-12 ENCOUNTER — Other Ambulatory Visit: Payer: Self-pay

## 2021-10-12 VITALS — BP 118/75 | HR 92 | Wt 250.0 lb

## 2021-10-12 DIAGNOSIS — Z0130 Encounter for examination of blood pressure without abnormal findings: Secondary | ICD-10-CM

## 2021-10-12 NOTE — Progress Notes (Signed)
Patient was assessed and managed by nursing staff during this encounter. I have reviewed the chart and agree with the documentation and plan. I have also made any necessary editorial changes.  Griffin Basil, MD 10/12/2021 9:33 AM

## 2021-10-12 NOTE — Progress Notes (Signed)
Subjective:  Phyllis Christian is a 21 y.o. female here for BP check.   Hypertension ROS: taking medications as instructed, no medication side effects noted, no TIA's, no chest pain on exertion, no dyspnea on exertion, and no swelling of ankles.    Objective:  BP 118/75    Pulse 92    Wt 250 lb (113.4 kg)    Breastfeeding Yes    BMI 40.35 kg/m   Appearance alert, well appearing, and in no distress. General exam BP noted to be well controlled today in office.    Assessment:   Blood Pressure well controlled.   Plan:  Current treatment plan is effective, no change in therapy. Keep checking BP at home and notify us if they are elevated. Keep PP visit appt on 11/13/21.

## 2021-11-13 ENCOUNTER — Ambulatory Visit: Payer: Medicaid Other | Admitting: Obstetrics

## 2021-12-11 ENCOUNTER — Ambulatory Visit: Payer: Medicaid Other | Admitting: Obstetrics

## 2021-12-17 IMAGING — DX DG CHEST 2V
2 series · 2 of 2 positions shown · non-contrast
Comparison: 08/23/2021

CLINICAL DATA: Asthma, chest pain, shortness of breath

EXAM:
CHEST - 2 VIEW

[chest pa]
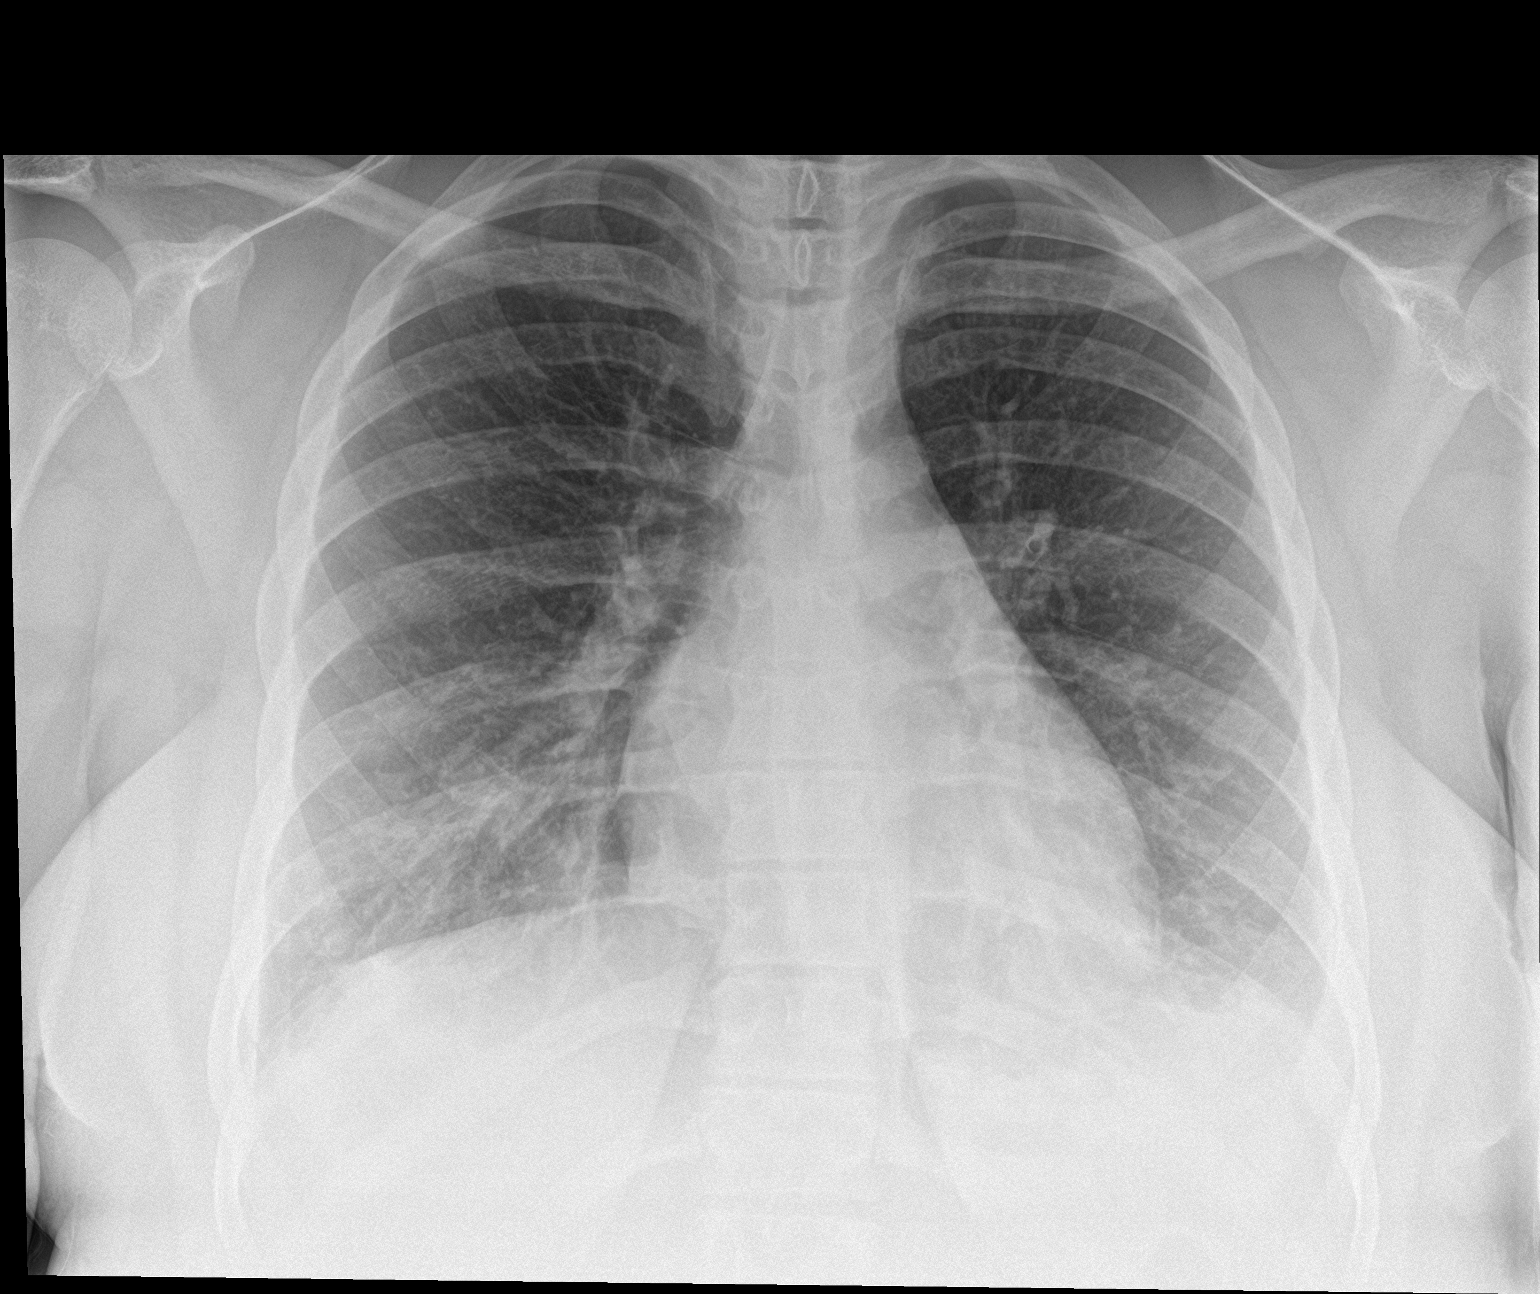

[chest lat]
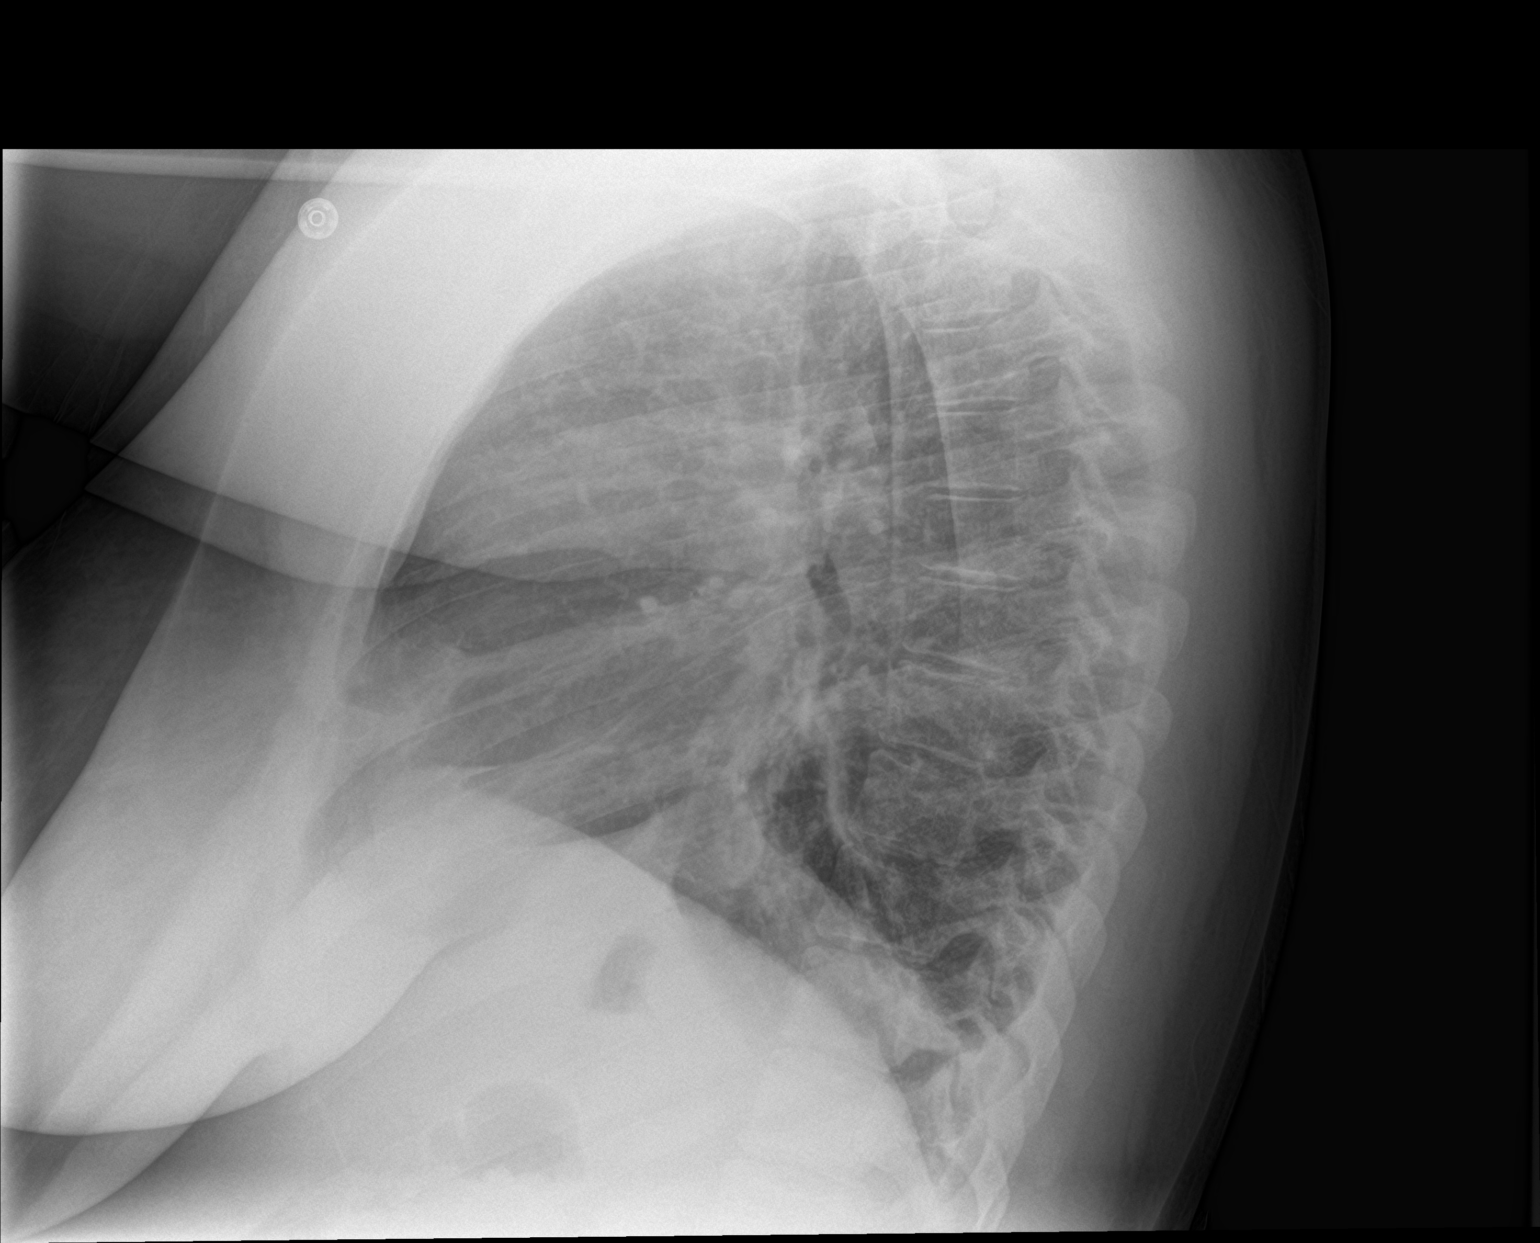

[2 of 2 positions shown; findings below may reference images not displayed]

FINDINGS: Bibasilar atelectasis. Heart is normal size. No effusions or
pneumothorax. No acute bony abnormality.
IMPRESSION: Bibasilar atelectasis.

## 2022-01-07 IMAGING — US US FETAL BPP W/ NON-STRESS
1 series · 11 of 11 positions shown · non-contrast
Comparison: none

[Series 1: us fetal bpp w/ non-stress · 11 acquisitions, 11 frames shown]
[im 1/11]
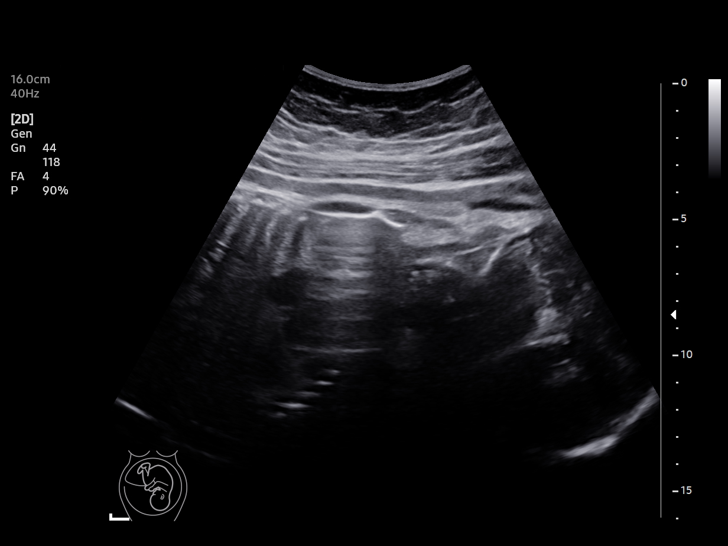
[im 2/11]
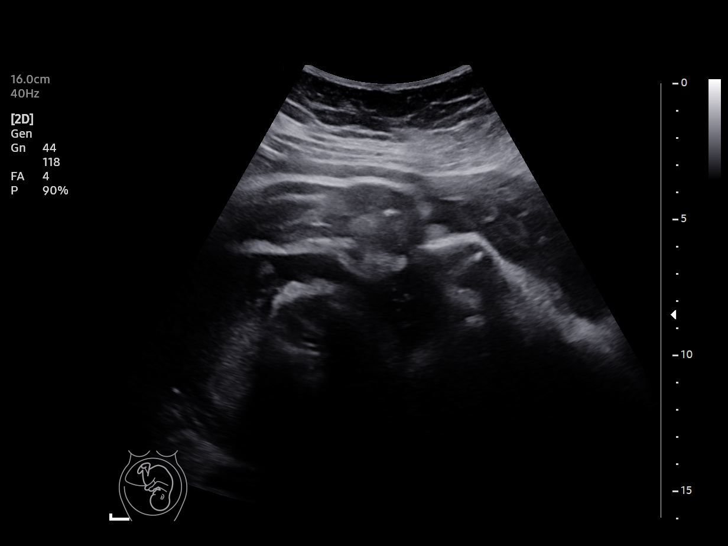
[im 3/11]
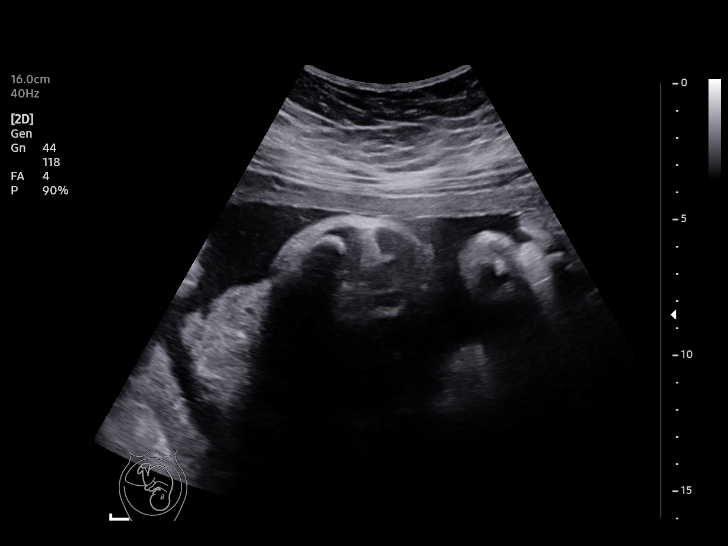
[im 4/11]
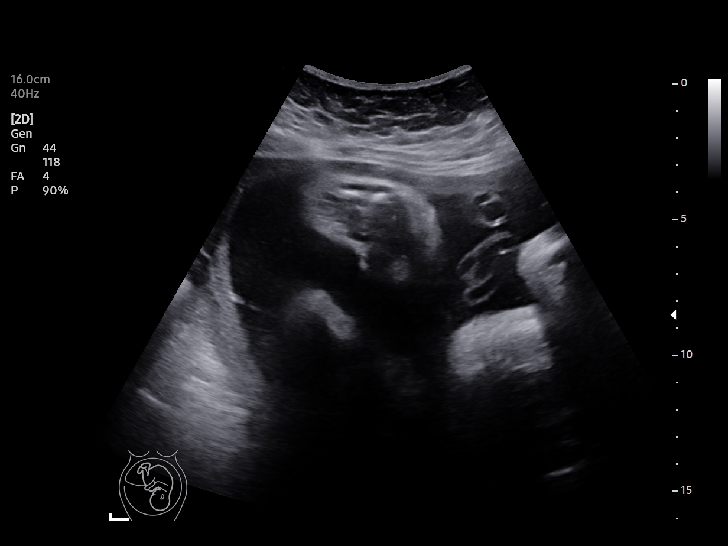
[im 5/11]
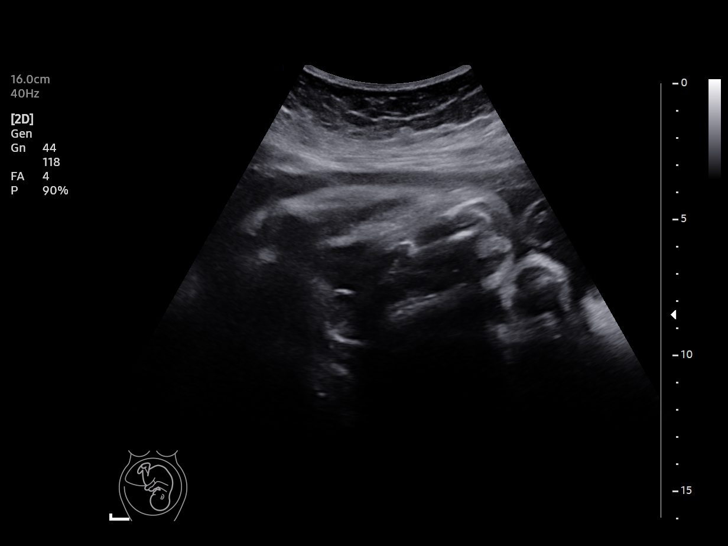
[im 6/11]
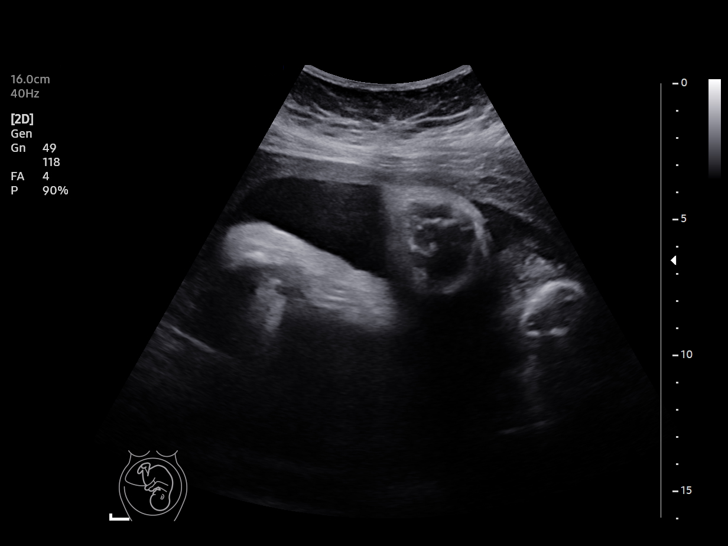
[im 7/11]
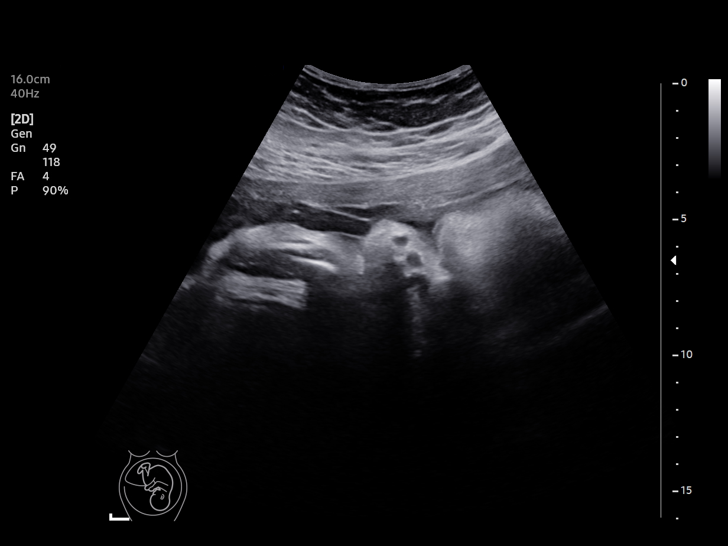
[im 8/11]
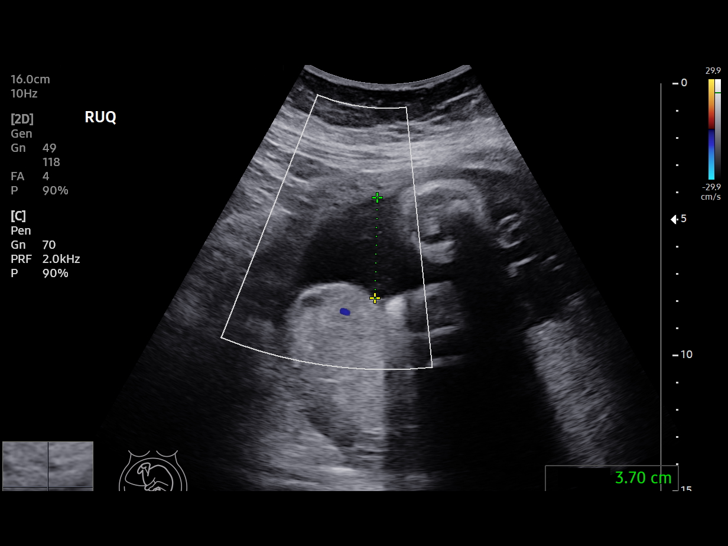
[im 9/11]
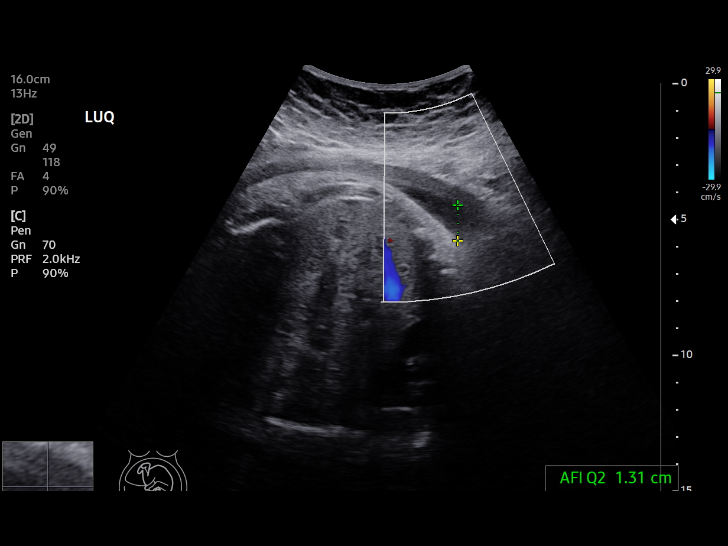
[im 10/11]
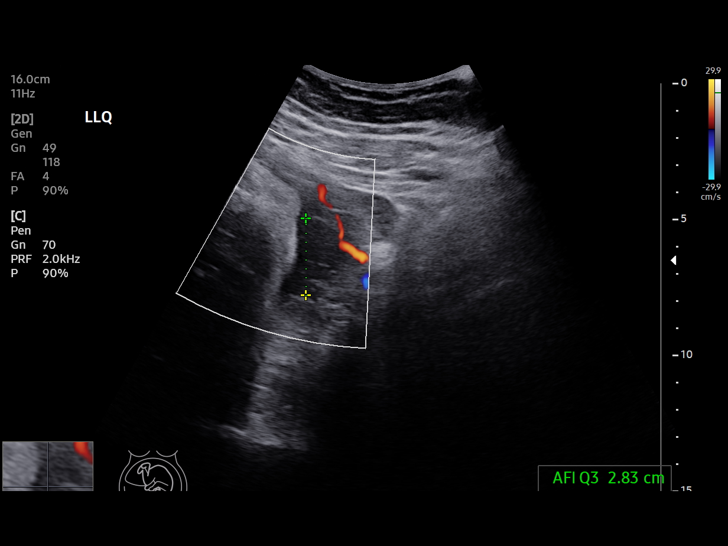
[im 11/11]
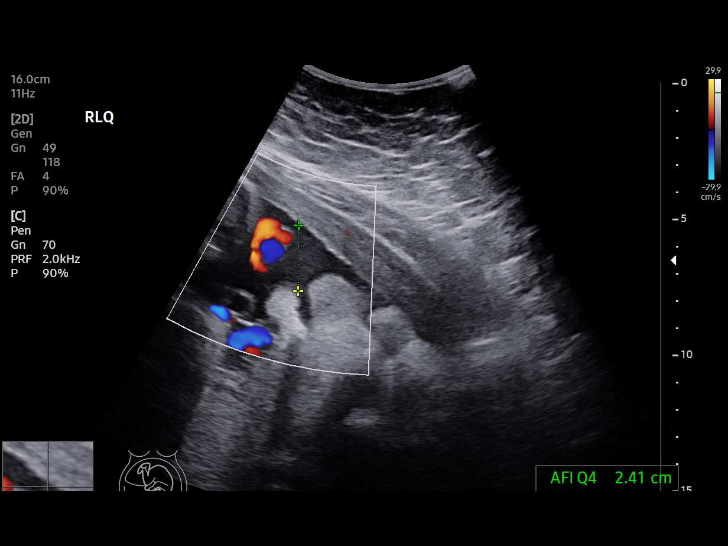

[11 of 11 positions shown; findings below may reference images not displayed]

[REDACTED]care at

 1  US FETAL BPP W/NONSTRESS              76818.4     FELNER CEOLA

Service(s) Provided

Indications

 37 weeks gestation of pregnancy
 Obesity complicating pregnancy, third
 trimester
Fetal Evaluation

 Num Of Fetuses:          1
 Preg. Location:          Intrauterine
 Cardiac Activity:        Observed
 Presentation:            Cephalic

 Amniotic Fluid
 AFI FV:      Within normal limits

 AFI Sum(cm)     %Tile       Largest Pocket(cm)
 10.25           27

 RUQ(cm)       RLQ(cm)       LUQ(cm)        LLQ(cm)

Biophysical Evaluation
 Amniotic F.V:   Pocket => 2 cm             F. Tone:         Observed
 F. Movement:    Observed                   N.S.T:           Reactive
 F. Breathing:   Not Observed               Score:           [DATE]
OB History

 Gravidity:    1
 Living:       0
Gestational Age

 LMP:           40w 4d        Date:  12/03/20                 EDD:   09/09/21
 Best:          37w 5d     Det. By:  Early Ultrasound         EDD:   09/29/21
                                     (02/21/21)
Impression

  NST is reactive. BPP 8 /10. Fetal Breathing not observe
Recommendations

 Continue weekly antenatal testing till delivery .

## 2022-03-07 ENCOUNTER — Telehealth: Payer: Self-pay

## 2022-03-07 NOTE — Telephone Encounter (Signed)
Maham requests RX for symbicort inhaler. Last [place] visit was over one year ago and time to transition to adult care provider. I gave Karolynn contact number for [facility]; she may need to go to urgent care for evaluation and symbicort RX.

## 2022-03-24 NOTE — Progress Notes (Deleted)
  SUBJECTIVE:   CHIEF COMPLAINT / HPI:   Initial Visit:   Asthma:    Patient has previously been evaluated for asthma and presents for an asthma follow-up.   Inhalers used:   Patient is not currently in exacerbation.   Symptoms currently include {asthma sx:406::"dyspnea","non-productive cough","wheezing"} and occur {frequency:16479}.    Observed precipitants include {asthma precips:408}.   Current limitations in activity from asthma: {none:33079}.   Number of days of work missed in the last month: {0-31:32273}.  Number of Emergency Department visits in the previous month: {none:33079}.  Frequency of use of quick-relief meds: ***.    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  General: NAD, pleasant, able to participate in exam Cardiac: RRR, no murmurs auscultated Respiratory: CTAB, normal WOB Abdomen: soft, non-tender, non-distended, normoactive bowel sounds Extremities: warm and well perfused, no edema or cyanosis Skin: warm and dry, no rashes noted Neuro: alert, no obvious focal deficits, speech normal Psych: Normal affect and mood  ASSESSMENT/PLAN:  No problem-specific Assessment & Plan notes found for this encounter.  Healthcare Maintenance - Vaccines: Needs TDAP - Colonoscopy: N/A - Mammogram: N/A - DEXA Scan: N/A - A1c: 03/21/21 5.1  - Lipid  Panel: 07/2015 Cholesterol 125 - 170 mg/dL 138  Triglycerides 38 - 135 mg/dL 48  HDL 37 - 75 mg/dL 58  Total CHOL/HDL Ratio <=5.0 Ratio 2.4  VLDL <30 mg/dL 10  LDL Cholesterol <110 mg/dL 70  - HIV NR 07/2021    No orders of the defined types were placed in this encounter.  No orders of the defined types were placed in this encounter.  No follow-ups on file.  Erskine Emery, MD PGY1 {    This will disappear when note is signed, click to select method of visit    :1}

## 2022-03-25 ENCOUNTER — Ambulatory Visit: Payer: Medicaid Other | Admitting: Student

## 2022-05-06 ENCOUNTER — Other Ambulatory Visit: Payer: Self-pay | Admitting: Obstetrics & Gynecology

## 2022-05-06 DIAGNOSIS — J4531 Mild persistent asthma with (acute) exacerbation: Secondary | ICD-10-CM | POA: Diagnosis not present

## 2022-05-06 DIAGNOSIS — J4541 Moderate persistent asthma with (acute) exacerbation: Secondary | ICD-10-CM

## 2022-05-09 ENCOUNTER — Ambulatory Visit: Payer: Medicaid Other | Admitting: Internal Medicine

## 2022-05-09 ENCOUNTER — Ambulatory Visit: Payer: Medicaid Other | Admitting: Student

## 2022-05-15 ENCOUNTER — Encounter: Payer: Self-pay | Admitting: Student

## 2022-05-15 ENCOUNTER — Ambulatory Visit (INDEPENDENT_AMBULATORY_CARE_PROVIDER_SITE_OTHER): Payer: Medicaid Other | Admitting: Student

## 2022-05-15 ENCOUNTER — Other Ambulatory Visit: Payer: Self-pay

## 2022-05-15 VITALS — BP 133/76 | HR 85 | Temp 97.7°F | Ht 66.0 in | Wt 271.0 lb

## 2022-05-15 DIAGNOSIS — R030 Elevated blood-pressure reading, without diagnosis of hypertension: Secondary | ICD-10-CM | POA: Diagnosis not present

## 2022-05-15 DIAGNOSIS — Z6841 Body Mass Index (BMI) 40.0 and over, adult: Secondary | ICD-10-CM | POA: Diagnosis not present

## 2022-05-15 DIAGNOSIS — J4520 Mild intermittent asthma, uncomplicated: Secondary | ICD-10-CM

## 2022-05-15 DIAGNOSIS — D649 Anemia, unspecified: Secondary | ICD-10-CM | POA: Diagnosis not present

## 2022-05-15 DIAGNOSIS — Z3200 Encounter for pregnancy test, result unknown: Secondary | ICD-10-CM

## 2022-05-15 DIAGNOSIS — M25519 Pain in unspecified shoulder: Secondary | ICD-10-CM | POA: Insufficient documentation

## 2022-05-15 DIAGNOSIS — Z3202 Encounter for pregnancy test, result negative: Secondary | ICD-10-CM

## 2022-05-15 DIAGNOSIS — Z0000 Encounter for general adult medical examination without abnormal findings: Secondary | ICD-10-CM

## 2022-05-15 DIAGNOSIS — M25511 Pain in right shoulder: Secondary | ICD-10-CM

## 2022-05-15 LAB — POCT URINE PREGNANCY: Preg Test, Ur: NEGATIVE

## 2022-05-15 MED ORDER — SYMBICORT 160-4.5 MCG/ACT IN AERO: 2.0000 | INHALATION_SPRAY | Freq: Two times a day (BID) | RESPIRATORY_TRACT | 3 refills | Status: DC

## 2022-05-15 NOTE — Progress Notes (Unsigned)
   CC: establish care  HPI:  [name] is a 21 y.o. 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 Patient reports having unprotected sex with monogamous partner.  Sexual history  Partners: Male partner for the past 4 years  Practices: penetrative, oral, anal.  Protection from STI; No condoms  Hx of STI: No, and not concerned   Tested?   Does pt or partner inject IV drugs?  Pregnancy planning:  recently had a baby in December. Not planning second pregnancy  Family history unsure  Social history Currently lives:[place] Employment : [employer] of [employer] Lives with: Her 6 mo child and her partner Diet, access to food: Patient reports poor diet.  No time. Exercise: Mostly stairs at work when she has to move objects.  Not much otherwise, attributes these to being busy. Weapons in the home: Denies  drugs Tobacco: Smokes cigarettes regularly, mentions she has been able to stop when she has wanted to in the past. Alcohol: Denies Other drugs: Marijuana use.  No injectables, no inhaled drugs  Medications None  Allergies Codeine and Motrin  Review of Systems: See below Review of Systems  Constitutional: Negative.   HENT: Negative.    Eyes: Negative.   Cardiovascular: Negative.   Gastrointestinal:  Positive for nausea. Negative for blood in stool, constipation and heartburn.  Genitourinary: Negative.   Musculoskeletal:  Negative for myalgias and neck pain.   Skin: Negative.   Endo/Heme/Allergies: Negative.   Psychiatric/Behavioral: Negative.       Physical Exam:  Vitals:   05/15/22 1552  BP: 133/76  Pulse: 85  Temp: 97.7 F (36.5 C)  TempSrc: Oral  SpO2: 98%  Weight: 270 lb 15.5 oz (122.9 kg)  Height: '5\' 6"'$  (1.676 m)   General: Pleasant, well-appearing person laying in bed. No acute distress. Head: Normocephalic. Atraumatic. CV: RRR. No murmurs, rubs, or gallops. No LE edema Pulmonary: Lungs CTAB. Normal effort. No wheezing or rales. Abdominal: Soft, nontender, nondistended. Normal bowel sounds. Extremities: Palpable radial and DP pulses. Normal ROM. Skin: Warm and dry. No obvious rash or lesions. Neuro: A&Ox3. Moves all extremities. Normal sensation. No focal deficit. Psych: Normal mood and affect  Assessment & Plan:   See Encounters Tab for problem based charting.  No problem-specific Assessment & Plan notes found for this encounter.    Patient seen with Dr. Dareen Piano

## 2022-05-15 NOTE — Patient Instructions (Addendum)
Thank you, [name] for allowing [name] to provide your care today. Today we discussed your   Missing your period: We did a urine pregnancy test that was negative. However, we would like to make sure you follow up with another urine pregnancy test. We have placed a referral to the OBGYN so that they can provide you with care, especially after you unfortunately missed your check ups after delivering your baby. Our referral coordinator will call you on Friday and provide you with the number to call to make this appointment. They could also instruct you with birth control and do your pap smear.   If you get your period before the appointment, feel free to come back and we would be happy to continue your care; give you birthcontrol options and do your pap smear. Until you get a birth control method in place, please use a barrier method/ a condom  Blood pressure We talked about the need to improve your diet and exercise routine. Continue to check your blood pressure periodically to avoid having to go back on medication, which you do not want. Remember that smoking makes this much worse; consider quitting or decreasing the number of cigarrettes you smoke. We have options to help you with the cravings!  Shoulder pain We recommend warm packs/ice, and topical over-the counter analgesics.  Your history of anemia We are checking your blood count to make sure your hemoglobin has normalized.   I have ordered the following labs for you:   Lab Orders         CBC no Diff         Iron, TIBC and Ferritin Panel         POCT Urine Pregnancy      I will call if any are abnormal. All of your labs can be accessed through "My Chart" Please make sure to arrive 15 minutes prior to your next appointment. If you arrive late, you may be asked to reschedule.    We look forward to seeing you next time. Please call our clinic at [phone] if you have any questions or concerns. The best time to call is Monday-Friday  from 9am-4pm, but there is someone available 24/7. If after hours or the weekend, call the main hospital number and ask for the Internal Medicine Resident On-Call. If you need medication refills, please notify your pharmacy one week in advance and they will send [name] a request.   Thank you for letting us take part in your care. Wishing you the best!  Romana Juniper, MD 05/15/2022, 4:52 PM [facility] Internal Medicine Resident, PGY-1

## 2022-05-16 DIAGNOSIS — R030 Elevated blood-pressure reading, without diagnosis of hypertension: Secondary | ICD-10-CM | POA: Insufficient documentation

## 2022-05-16 DIAGNOSIS — D649 Anemia, unspecified: Secondary | ICD-10-CM | POA: Insufficient documentation

## 2022-05-16 LAB — IRON,TIBC AND FERRITIN PANEL
Ferritin: 15 ng/mL (ref 15–150)
Iron Saturation: 10 % — ABNORMAL LOW (ref 15–55)
Iron: 45 ug/dL (ref 27–159)
Total Iron Binding Capacity: 431 ug/dL (ref 250–450)
UIBC: 386 ug/dL (ref 131–425)

## 2022-05-16 LAB — CBC
Hematocrit: 35.2 % (ref 34.0–46.6)
Hemoglobin: 11.3 g/dL (ref 11.1–15.9)
MCH: 24.7 pg — ABNORMAL LOW (ref 26.6–33.0)
MCHC: 32.1 g/dL (ref 31.5–35.7)
MCV: 77 fL — ABNORMAL LOW (ref 79–97)
Platelets: 336 10*3/uL (ref 150–450)
RBC: 4.58 x10E6/uL (ref 3.77–5.28)
RDW: 15.6 % — ABNORMAL HIGH (ref 11.7–15.4)
WBC: 15 10*3/uL — ABNORMAL HIGH (ref 3.4–10.8)

## 2022-05-16 NOTE — Assessment & Plan Note (Signed)
Patient reports she has not been using her recue inhaler as often as when she was pregnant. She still uses it <4 times a week. Continues to use Symbicort daily. Denies exacerbations in the past few months. Patient endorses tobacco use. No respiratory wheezes today and clear to auscultation bilaterally. Patient was encouraged to stop smoking and refill of Symbicort was sent to the pharmacy.

## 2022-05-16 NOTE — Assessment & Plan Note (Signed)
Patient with history of anemia. Previously on iron supplementation but reports she has not been taking it for a very long time. Last Hgb 8.9. Patient ROS was unremarkable and patient denies overt signs of bleeding. Will repeat cbc and iron studies today to check her current status and guide management. - CBC - Ferritin and Iron studies today

## 2022-05-16 NOTE — Assessment & Plan Note (Signed)
Patient with elevated blood pressure today. Patient with history of gestational hypertension, previously on medication. Patient denies taking medications "for a long time". She has not had follow ups with PCP or OB after delivery. Denies HA, changes in vision, chest pain, or SOB or DOE. Patient denies hx of snoring. Recommended smoking cessation, diet modification and increase exercise for cardiovascular endurance and weight loss

## 2022-05-16 NOTE — Assessment & Plan Note (Signed)
Patient encouraged to pursue lifestyle modifications as her current weight exacerbates her current comorbities and puts her a risk of metabolic disease. Will continue to monitor and explore management at follow up.

## 2022-05-16 NOTE — Assessment & Plan Note (Signed)
Phyllis Christian reported a 3 day history of shoulder pain, 2/10 that she noted after waking up. The pain does not radiate and it does not increase with moving her R arm. She has not tried over the counter pain medications. There is no point tenderness on spine or joint on exam. Full range on motion without pain. Muscle tension noted along scapula. - Instructed patient to try warm packs/heat to ease tension - Suggested massages - If pain, add topical pain medications. No orals given potential pregnancy

## 2022-05-16 NOTE — Assessment & Plan Note (Signed)
Patient presented to clinic to establish care with worries about pregnancy. Patient has had regular periods since she had her last delivery in December 2022. She is not planning to have more children in the near future, but was having unprotected sex with her long term partner and father of child. Of note, patient reports she has never had a pap smear or OB check up after her delivery in December. Today, patient was offered a pap smear and she declined, preferring it to be done at a later time.   Urine test in clinic was negative. Patient is still concerned about pregnancy. Patient was offered the option to be referred to OBGYN to follow up for her potential pregnancy. She was also advised to follow up for cervical cancer screening and for birth control if she is not planning a pregnancy in the near future. Patient was encouraged to come back to [facility] clinic if not pregnant to follow care.

## 2022-05-17 NOTE — Progress Notes (Signed)
Internal Medicine Clinic Attending  I saw and evaluated the patient.  I personally confirmed the key portions of the history and exam documented by Dr. Gomez-Caraballo and I reviewed pertinent patient test results.  The assessment, diagnosis, and plan were formulated together and I agree with the documentation in the resident's note.  

## 2022-06-20 ENCOUNTER — Ambulatory Visit: Payer: Medicaid Other | Admitting: Obstetrics and Gynecology

## 2022-07-18 ENCOUNTER — Encounter: Payer: Self-pay | Admitting: Obstetrics and Gynecology

## 2022-07-18 ENCOUNTER — Other Ambulatory Visit (HOSPITAL_COMMUNITY)
Admission: RE | Admit: 2022-07-18 | Discharge: 2022-07-18 | Disposition: A | Discharge status: Home / Self Care | Payer: Medicaid Other | Source: Ambulatory Visit | Attending: Obstetrics and Gynecology | Admitting: Obstetrics and Gynecology

## 2022-07-18 ENCOUNTER — Ambulatory Visit (INDEPENDENT_AMBULATORY_CARE_PROVIDER_SITE_OTHER): Payer: Medicaid Other | Admitting: Obstetrics and Gynecology

## 2022-07-18 VITALS — BP 113/69 | HR 86 | Ht 66.0 in | Wt 283.0 lb

## 2022-07-18 DIAGNOSIS — Z01419 Encounter for gynecological examination (general) (routine) without abnormal findings: Secondary | ICD-10-CM

## 2022-07-18 DIAGNOSIS — Z0000 Encounter for general adult medical examination without abnormal findings: Secondary | ICD-10-CM | POA: Diagnosis not present

## 2022-07-18 DIAGNOSIS — Z30011 Encounter for initial prescription of contraceptive pills: Secondary | ICD-10-CM | POA: Diagnosis not present

## 2022-07-18 LAB — POCT URINE PREGNANCY: Preg Test, Ur: NEGATIVE

## 2022-07-18 MED ORDER — NORETHIN-ETH ESTRAD-FE BIPHAS 1 MG-10 MCG / 10 MCG PO TABS: 1.0000 | ORAL_TABLET | Freq: Every day | ORAL | 11 refills | Status: DC

## 2022-07-18 NOTE — Progress Notes (Addendum)
21 y.o GYN presents for AEX/PAP and [facility] consult.  C/o headaches 10/10 x 2 weeks.  She wants OCP.  UPT today is Negative.

## 2022-07-18 NOTE — Progress Notes (Signed)
WELL-WOMAN PHYSICAL & PAP Patient name: Phyllis Christian MRN [id number]  Date of birth: 2001-07-10 Chief Complaint:   Gynecologic Exam  History of Present Illness:   Phyllis Christian is a 21 y.o. G10P1001 African American female being seen today for a routine well-woman exam.  Current complaints: desires to start on [place] pills for regulation of period and prevention of pregnancy  PCP: Roselind Messier      does not desire labs Patient's last menstrual period was 06/26/2022 (exact date). The current method of family planning is none.  Last pap never had.  Last mammogram: n/a. Family h/o breast cancer: No Last colonoscopy: n/a. Family h/o colorectal cancer: No Review of Systems:   Pertinent items are noted in HPI Denies any headaches, blurred vision, fatigue, shortness of breath, chest pain, abdominal pain, abnormal vaginal discharge/itching/odor/irritation, problems with periods, bowel movements, urination, or intercourse unless otherwise stated above. Pertinent History Reviewed:  Reviewed past medical,surgical, social and family history.  Reviewed problem list, medications and allergies. Physical Assessment:   Vitals:   07/18/22 1319 07/18/22 1351  BP: 130/88 113/69  Pulse: 88 86  Weight: 283 lb (128.4 kg)   Height: '5\' 6"'$  (1.676 m)   Body mass index is 45.68 kg/m.        Physical Examination:   General appearance - well appearing, and in no distress  Mental status - alert, oriented to person, place, and time  Psych:  She has a normal mood and affect  Skin - warm and dry, normal color, no suspicious lesions noted  Chest - effort normal, all lung fields clear to auscultation bilaterally  Heart - normal rate and regular rhythm  Neck:  midline trachea, no thyromegaly or nodules  Breasts - breasts appear normal, no suspicious masses, no skin or nipple changes or  axillary nodes  Abdomen - soft, nontender, nondistended, no masses or organomegaly  Pelvic - VULVA: normal appearing vulva with  no masses, tenderness or lesions  VAGINA: normal appearing vagina with normal color and discharge, no lesions  CERVIX: normal appearing cervix without discharge or lesions, no CMT  Thin prep pap is done with reflex HR HPV cotesting  UTERUS: uterus is felt to be normal size, shape, consistency and nontender   ADNEXA: No adnexal masses or tenderness noted.  Rectal - deferred  Extremities:  No swelling or varicosities noted  No results found for this or any previous visit (from the past 24 hour(s)).   Assessment & Plan:  1) Encounter for well woman exam with routine gynecological exam  - Cytology - PAP( [place]), POCT urine pregnancy  2) Encounter for initial prescription of contraceptive pills  - Discussed her Risk category according to [name] MEC/US SPR being level 2 - Rx: Norethindrone-Ethinyl Estradiol-Fe Biphas (LO LOESTRIN FE) 1 MG-10 MCG / 10 MCG tablet  Labs/procedures today: pap  Mammogram at age 67 or sooner if problems Colonoscopy at age 87 or sooner if problems  Orders Placed This Encounter  Procedures   POCT urine pregnancy    Meds:  Meds ordered this encounter  Medications   Norethindrone-Ethinyl Estradiol-Fe Biphas (LO LOESTRIN FE) 1 MG-10 MCG / 10 MCG tablet    Sig: Take 1 tablet by mouth daily.    Dispense:  28 tablet    Refill:  11    Order Specific Question:   Supervising Provider    Answer:   Donnamae Jude [[id number]]    Follow-up: Return in about 1 year (around 07/19/2023) for Annual Exam.  Total face-to-face time spent during this encounter was 10 minutes. There was 5 minutes of chart review time spent prior to this encounter. Total time spent = 15 minutes.   Laury Deep MSN, CNM 07/18/2022

## 2022-07-22 ENCOUNTER — Encounter: Payer: Self-pay | Admitting: Obstetrics and Gynecology

## 2022-07-22 LAB — CYTOLOGY - PAP: Diagnosis: NEGATIVE

## 2022-09-01 ENCOUNTER — Encounter (HOSPITAL_COMMUNITY): Payer: Self-pay

## 2022-09-01 ENCOUNTER — Ambulatory Visit (HOSPITAL_COMMUNITY)
Admission: EM | Admit: 2022-09-01 | Discharge: 2022-09-01 | Disposition: A | Discharge status: Home / Self Care | Payer: Medicaid Other | Attending: Physician Assistant | Admitting: Physician Assistant

## 2022-09-01 DIAGNOSIS — J4541 Moderate persistent asthma with (acute) exacerbation: Secondary | ICD-10-CM | POA: Diagnosis not present

## 2022-09-01 MED ORDER — PREDNISONE 10 MG (21) PO TBPK: ORAL_TABLET | Freq: Every day | ORAL | 0 refills | Status: DC

## 2022-09-01 MED ORDER — IPRATROPIUM-ALBUTEROL 0.5-2.5 (3) MG/3ML IN SOLN
RESPIRATORY_TRACT | Status: AC
  Filled 2022-09-01: qty 3

## 2022-09-01 MED ORDER — METHYLPREDNISOLONE SODIUM SUCC 125 MG IJ SOLR
80.0000 mg | Freq: Once | INTRAMUSCULAR | Status: AC
  Administered 2022-09-01: 80 mg via INTRAMUSCULAR

## 2022-09-01 MED ORDER — IPRATROPIUM-ALBUTEROL 0.5-2.5 (3) MG/3ML IN SOLN
3.0000 mL | Freq: Once | RESPIRATORY_TRACT | Status: AC
Start: 2022-09-01 — End: 2022-09-01
  Administered 2022-09-01: 3 mL via RESPIRATORY_TRACT

## 2022-09-01 MED ORDER — ALBUTEROL SULFATE HFA 108 (90 BASE) MCG/ACT IN AERS: 1.0000 | INHALATION_SPRAY | Freq: Four times a day (QID) | RESPIRATORY_TRACT | 0 refills | Status: DC | PRN

## 2022-09-01 MED ORDER — ALBUTEROL SULFATE (2.5 MG/3ML) 0.083% IN NEBU
INHALATION_SOLUTION | RESPIRATORY_TRACT | Status: AC
  Filled 2022-09-01: qty 3

## 2022-09-01 MED ORDER — METHYLPREDNISOLONE SODIUM SUCC 125 MG IJ SOLR: 125.0000 mg | Freq: Once | INTRAMUSCULAR | Status: DC

## 2022-09-01 MED ORDER — ALBUTEROL SULFATE (2.5 MG/3ML) 0.083% IN NEBU
2.5000 mg | INHALATION_SOLUTION | Freq: Once | RESPIRATORY_TRACT | Status: AC
  Administered 2022-09-01: 2.5 mg via RESPIRATORY_TRACT

## 2022-09-01 MED ORDER — METHYLPREDNISOLONE SODIUM SUCC 125 MG IJ SOLR
INTRAMUSCULAR | Status: AC
  Filled 2022-09-01: qty 2

## 2022-09-01 NOTE — ED Provider Notes (Addendum)
[facility]    CSN: [id number] Arrival date & time: 09/01/22  1049      History   Chief Complaint Chief Complaint  Patient presents with   Asthma   Shortness of Breath    HPI Phyllis Christian is a 21 y.o. female.   Pt with h/o asthma complains of shortness of breath, wheezing that started 5 days ago.  Denies congestion, cough, fever, chills, sinus pressure.  She reports negative home covid test.  She does not have a rescue inhaler, she has neb solution, but no neb machine.      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    Patient Active Problem List   Diagnosis Date Noted   Elevated blood pressure reading 05/16/2022   Obesity, Class III, BMI 40-49.9 (morbid obesity) ([place]) 05/16/2022   Anemia 05/16/2022   Encounter for pregnancy test 05/15/2022   Shoulder pain 05/15/2022   Encounter for post surgical wound check 10/04/2021   Gestational hypertension 09/26/2021   Echogenic intracardiac focus of fetus on prenatal ultrasound 06/20/2021   Maternal obesity affecting pregnancy, antepartum 04/17/2021   Supervision of other normal pregnancy, antepartum 02/08/2021   Obstructive sleep apnea 08/04/2015   Mild intermittent asthma without complication 29/19/1660   Sickle cell trait [facility])     Past Surgical History:  Procedure Laterality Date   CESAREAN SECTION N/A 09/27/2021   Procedure: CESAREAN SECTION;  Surgeon: Caren Macadam, MD;  Location: [facility];  Service: Obstetrics;  Laterality: N/A;  with epidural    OB History     Gravida  1   Para  1   Term  1   Preterm      AB      Living  1      SAB      IAB      Ectopic      Multiple  0   Live Births  1            Home Medications    Prior to Admission medications   Medication Sig Start Date End Date Taking?  Authorizing Provider  acetaminophen (TYLENOL) 500 MG tablet Take 500 mg by mouth every 8 (eight) hours as needed for mild pain.   Yes [provider]  albuterol (VENTOLIN HFA) 108 (90 Base) MCG/ACT inhaler Inhale 1-2 puffs into the lungs every 6 (six) hours as needed for wheezing or shortness of breath. 09/01/22  Yes Ward, Lenise Arena, PA-C  Iron Polysacch Cmplx-B12-FA 150-0.025-1 MG CAPS Take 1 capsule by mouth every other day. 09/20/21  Yes Shelly Bombard, MD  Norethindrone-Ethinyl Estradiol-Fe Biphas (LO LOESTRIN FE) 1 MG-10 MCG / 10 MCG tablet Take 1 tablet by mouth daily. 07/18/22  Yes Renato Battles, [place], CNM  predniSONE (STERAPRED UNI-PAK 21 TAB) 10 MG (21) TBPK tablet Take by mouth daily. Take 6 tabs by mouth daily  for 2 days, then 5 tabs for 2 days, then 4 tabs for 2 days, then 3 tabs for 2 days, 2 tabs for 2 days, then 1 tab by mouth daily for 2 days 09/01/22  Yes Ward, Lenise Arena, PA-C  SYMBICORT 160-4.5 MCG/ACT inhaler Inhale 2 puffs into the lungs 2 (two) times daily. 05/15/22  Yes Atway, Rayann N, DO  Prenatal Vit-Fe Fumarate-FA (  PRENATAL VITAMIN) 27-0.8 MG TABS Take 1 tablet by mouth daily. 02/08/21   Gladys Damme, MD    Family History Family History  Problem Relation Age of Onset   Diabetes Maternal Grandfather    Obesity Mother    Diabetes Mother    Asthma Father     Social History Social History   Tobacco Use   Smoking status: Some Days    Types: Cigarettes    Passive exposure: Yes   Smokeless tobacco: Never   Tobacco comments:    outside smoker  Vaping Use   Vaping Use: Never used  Substance Use Topics   Alcohol use: No   Drug use: No     Allergies   Codeine and Motrin [ibuprofen]   Review of Systems Review of Systems  Constitutional:  Negative for chills and fever.  HENT:  Negative for ear pain and sore throat.   Eyes:  Negative for pain and visual disturbance.  Respiratory:  Positive for shortness of breath and wheezing. Negative for cough.    Cardiovascular:  Negative for chest pain and palpitations.  Gastrointestinal:  Negative for abdominal pain and vomiting.  Genitourinary:  Negative for dysuria and hematuria.  Musculoskeletal:  Negative for arthralgias and back pain.  Skin:  Negative for color change and rash.  Neurological:  Negative for seizures and syncope.  All other systems reviewed and are negative.    Physical Exam Triage Vital Signs ED Triage Vitals  Enc Vitals Group     BP 09/01/22 1212 133/84     Pulse Rate 09/01/22 1212 (!) 110     Resp 09/01/22 1212 20     Temp 09/01/22 1212 99.1 F (37.3 C)     Temp Source 09/01/22 1212 Oral     SpO2 09/01/22 1212 91 %     Weight --      Height --      Head Circumference --      Peak Flow --      Pain Score 09/01/22 1211 8     Pain Loc --      Pain Edu? --      Excl. in [place]? --    No data found.  Updated Vital Signs BP 133/84 (BP Location: Left Arm)   Pulse (!) 110   Temp 99.1 F (37.3 C) (Oral)   Resp 20   LMP 07/06/2022 (Approximate)   SpO2 91%   Breastfeeding No   Visual Acuity Right Eye Distance:   Left Eye Distance:   Bilateral Distance:    Right Eye Near:   Left Eye Near:    Bilateral Near:     Physical Exam Vitals and nursing note reviewed.  Constitutional:      General: She is not in acute distress.    Appearance: She is well-developed.  HENT:     Head: Normocephalic and atraumatic.  Eyes:     Conjunctiva/sclera: Conjunctivae normal.  Cardiovascular:     Rate and Rhythm: Normal rate and regular rhythm.     Heart sounds: No murmur heard. Pulmonary:     Effort: Pulmonary effort is normal. No respiratory distress.     Breath sounds: Examination of the right-upper field reveals wheezing and rales. Examination of the left-upper field reveals wheezing and rales. Examination of the right-middle field reveals wheezing. Examination of the left-middle field reveals wheezing. Wheezing and rales present.  Abdominal:     Palpations: Abdomen  is soft.     Tenderness: There is no abdominal tenderness.  Musculoskeletal:        General: No swelling.     Cervical back: Neck supple.  Skin:    General: Skin is warm and dry.     Capillary Refill: Capillary refill takes less than 2 seconds.  Neurological:     Mental Status: She is alert.  Psychiatric:        Mood and Affect: Mood normal.      UC Treatments / Results  Labs (all labs ordered are listed, but only abnormal results are displayed) Labs Reviewed - No data to display  EKG   Radiology No results found.  Procedures Procedures (including critical care time)  Medications Ordered in UC Medications  ipratropium-albuterol (DUONEB) 0.5-2.5 (3) MG/3ML nebulizer solution 3 mL (3 mLs Nebulization Given 09/01/22 1218)  albuterol (PROVENTIL) (2.5 MG/3ML) 0.083% nebulizer solution 2.5 mg (2.5 mg Nebulization Given 09/01/22 1253)  methylPREDNISolone sodium succinate (SOLU-MEDROL) 125 mg/2 mL injection 80 mg (80 mg Intramuscular Given 09/01/22 1253)    Initial Impression / Assessment and Plan / UC Course  I have reviewed the triage vital signs and the nursing notes.  Pertinent labs & imaging results that were available during my care of the patient were reviewed by me and considered in my medical decision making (see chart for details).     Asthma exacerbation.  Duoneb given in clinic today with some improvement.  Albuterol neb given with improvement.  Solumedrol given in clinic.  O2 sat following neb treatments up to 93-94%.  Pt reports improvement as well.  Pt speaking in full sentences, lungs sounds improved, stable for discharge.  Rescue inhaler sent to pharmacy as well as prednisone dosepak.  Given paper prescription for home neb machine, none in stock in our clinic today.  Strict ED precautions given.  Final Clinical Impressions(s) / UC Diagnoses   Final diagnoses:  Moderate persistent asthma with exacerbation     Discharge Instructions      Use inhaler as  needed Start prednisone taper Return if symptoms do not improve or become worse    ED Prescriptions     Medication Sig Dispense Auth. Provider   predniSONE (STERAPRED UNI-PAK 21 TAB) 10 MG (21) TBPK tablet Take by mouth daily. Take 6 tabs by mouth daily  for 2 days, then 5 tabs for 2 days, then 4 tabs for 2 days, then 3 tabs for 2 days, 2 tabs for 2 days, then 1 tab by mouth daily for 2 days 42 tablet Ward, Lenise Arena, PA-C   albuterol (VENTOLIN HFA) 108 (90 Base) MCG/ACT inhaler Inhale 1-2 puffs into the lungs every 6 (six) hours as needed for wheezing or shortness of breath. 1 each Ward, Lenise Arena, PA-C      PDMP not reviewed this encounter.   Ward, Lenise Arena, PA-C 09/01/22 1342    Ward, Lenise Arena, PA-C 09/01/22 1451

## 2022-09-01 NOTE — ED Triage Notes (Signed)
Chief Complaint: Patient states she is having SOB with walking. Chest pain; cough. Patient has negative Covid test.   Onset: Wednesday  OTC medications tried: Yes- Symbicort    with no relief  Sick exposure: Yes- her son; no testing.   New foods or medications: No  Recent Travel: No

## 2022-09-01 NOTE — Discharge Instructions (Addendum)
Use inhaler as needed Start prednisone taper Return if symptoms do not improve or become worse

## 2022-11-15 ENCOUNTER — Other Ambulatory Visit: Payer: Self-pay | Admitting: Internal Medicine

## 2022-11-15 DIAGNOSIS — J4520 Mild intermittent asthma, uncomplicated: Secondary | ICD-10-CM

## 2022-11-18 DIAGNOSIS — J45901 Unspecified asthma with (acute) exacerbation: Secondary | ICD-10-CM | POA: Diagnosis not present

## 2022-12-14 ENCOUNTER — Encounter (HOSPITAL_BASED_OUTPATIENT_CLINIC_OR_DEPARTMENT_OTHER): Payer: Self-pay

## 2022-12-14 ENCOUNTER — Emergency Department (HOSPITAL_BASED_OUTPATIENT_CLINIC_OR_DEPARTMENT_OTHER): Payer: Medicaid Other

## 2022-12-14 ENCOUNTER — Emergency Department (HOSPITAL_BASED_OUTPATIENT_CLINIC_OR_DEPARTMENT_OTHER): Payer: Medicaid Other | Admitting: Radiology

## 2022-12-14 ENCOUNTER — Other Ambulatory Visit: Payer: Self-pay

## 2022-12-14 ENCOUNTER — Emergency Department (HOSPITAL_BASED_OUTPATIENT_CLINIC_OR_DEPARTMENT_OTHER)
Admission: EM | Admit: 2022-12-14 | Discharge: 2022-12-14 | Disposition: A | Discharge status: Home / Self Care | Payer: Medicaid Other | Attending: Emergency Medicine | Admitting: Emergency Medicine

## 2022-12-14 DIAGNOSIS — J181 Lobar pneumonia, unspecified organism: Secondary | ICD-10-CM | POA: Diagnosis not present

## 2022-12-14 DIAGNOSIS — J189 Pneumonia, unspecified organism: Secondary | ICD-10-CM

## 2022-12-14 DIAGNOSIS — J168 Pneumonia due to other specified infectious organisms: Secondary | ICD-10-CM | POA: Diagnosis not present

## 2022-12-14 DIAGNOSIS — J45901 Unspecified asthma with (acute) exacerbation: Secondary | ICD-10-CM

## 2022-12-14 DIAGNOSIS — Z7951 Long term (current) use of inhaled steroids: Secondary | ICD-10-CM | POA: Diagnosis not present

## 2022-12-14 DIAGNOSIS — Z1152 Encounter for screening for COVID-19: Secondary | ICD-10-CM | POA: Insufficient documentation

## 2022-12-14 DIAGNOSIS — R059 Cough, unspecified: Secondary | ICD-10-CM | POA: Diagnosis not present

## 2022-12-14 DIAGNOSIS — R0602 Shortness of breath: Secondary | ICD-10-CM | POA: Diagnosis not present

## 2022-12-14 LAB — CBC
HCT: 33 % — ABNORMAL LOW (ref 36.0–46.0)
Hemoglobin: 10.1 g/dL — ABNORMAL LOW (ref 12.0–15.0)
MCH: 21.3 pg — ABNORMAL LOW (ref 26.0–34.0)
MCHC: 30.6 g/dL (ref 30.0–36.0)
MCV: 69.5 fL — ABNORMAL LOW (ref 80.0–100.0)
Platelets: 308 10*3/uL (ref 150–400)
RBC: 4.75 MIL/uL (ref 3.87–5.11)
RDW: 19.4 % — ABNORMAL HIGH (ref 11.5–15.5)
WBC: 12.4 10*3/uL — ABNORMAL HIGH (ref 4.0–10.5)
nRBC: 0 % (ref 0.0–0.2)

## 2022-12-14 LAB — RESP PANEL BY RT-PCR (RSV, FLU A&B, COVID)  RVPGX2
Influenza A by PCR: NEGATIVE
Influenza B by PCR: NEGATIVE
Resp Syncytial Virus by PCR: NEGATIVE
SARS Coronavirus 2 by RT PCR: NEGATIVE

## 2022-12-14 LAB — BASIC METABOLIC PANEL
Anion gap: 8 (ref 5–15)
BUN: 6 mg/dL (ref 6–20)
CO2: 21 mmol/L — ABNORMAL LOW (ref 22–32)
Calcium: 9.2 mg/dL (ref 8.9–10.3)
Chloride: 107 mmol/L (ref 98–111)
Creatinine, Ser: 0.83 mg/dL (ref 0.44–1.00)
GFR, Estimated: >60 mL/min (ref >60)
Glucose, Bld: 83 mg/dL (ref 70–99)
Potassium: 3.8 mmol/L (ref 3.5–5.1)
Sodium: 136 mmol/L (ref 135–145)

## 2022-12-14 LAB — HCG, SERUM, QUALITATIVE: Preg, Serum: NEGATIVE

## 2022-12-14 LAB — GROUP A STREP BY PCR: Group A Strep by PCR: NOT DETECTED

## 2022-12-14 MED ORDER — PREDNISONE 10 MG (21) PO TBPK: ORAL_TABLET | Freq: Every day | ORAL | 0 refills | Status: DC

## 2022-12-14 MED ORDER — ALBUTEROL SULFATE (2.5 MG/3ML) 0.083% IN NEBU
2.5000 mg | INHALATION_SOLUTION | Freq: Once | RESPIRATORY_TRACT | Status: AC
  Administered 2022-12-14: 2.5 mg via RESPIRATORY_TRACT
  Filled 2022-12-14: qty 3

## 2022-12-14 MED ORDER — ALBUTEROL SULFATE (2.5 MG/3ML) 0.083% IN NEBU
5.0000 mg | INHALATION_SOLUTION | Freq: Once | RESPIRATORY_TRACT | Status: AC
  Administered 2022-12-14: 5 mg via RESPIRATORY_TRACT
  Filled 2022-12-14: qty 6

## 2022-12-14 MED ORDER — SODIUM CHLORIDE 0.9 % IV BOLUS
1000.0000 mL | Freq: Once | INTRAVENOUS | Status: AC
  Administered 2022-12-14: 1000 mL via INTRAVENOUS

## 2022-12-14 MED ORDER — DOXYCYCLINE HYCLATE 100 MG PO CAPS: 100.0000 mg | ORAL_CAPSULE | Freq: Two times a day (BID) | ORAL | 0 refills | Status: DC

## 2022-12-14 MED ORDER — ALBUTEROL SULFATE HFA 108 (90 BASE) MCG/ACT IN AERS
2.0000 | INHALATION_SPRAY | RESPIRATORY_TRACT | Status: DC | PRN
  Administered 2022-12-14: 2 via RESPIRATORY_TRACT
  Filled 2022-12-14: qty 6.7

## 2022-12-14 MED ORDER — IPRATROPIUM-ALBUTEROL 0.5-2.5 (3) MG/3ML IN SOLN
3.0000 mL | Freq: Once | RESPIRATORY_TRACT | Status: AC
  Administered 2022-12-14: 3 mL via RESPIRATORY_TRACT
  Filled 2022-12-14: qty 3

## 2022-12-14 MED ORDER — MAGNESIUM SULFATE 2 GM/50ML IV SOLN
2.0000 g | Freq: Once | INTRAVENOUS | Status: AC
  Administered 2022-12-14: 2 g via INTRAVENOUS
  Filled 2022-12-14: qty 50

## 2022-12-14 MED ORDER — ALBUTEROL SULFATE HFA 108 (90 BASE) MCG/ACT IN AERS: 1.0000 | INHALATION_SPRAY | Freq: Four times a day (QID) | RESPIRATORY_TRACT | 0 refills | Status: DC | PRN

## 2022-12-14 MED ORDER — METHYLPREDNISOLONE SODIUM SUCC 125 MG IJ SOLR
125.0000 mg | Freq: Once | INTRAMUSCULAR | Status: AC
  Administered 2022-12-14: 125 mg via INTRAVENOUS
  Filled 2022-12-14: qty 2

## 2022-12-14 NOTE — ED Provider Notes (Signed)
[facility] Provider Note   CSN: [id number] Arrival date & time: 12/14/22  1447     History  Chief Complaint  Patient presents with   Shortness of Breath    Phyllis Christian is a 22 y.o. female with asthma presented with shortness of breath that began yesterday.  Patient stated that she ran out of her albuterol inhaler yesterday after the increased use.  Patient states he was at work today and that is when symptoms worsen and she came to the ED.  Patient stated she felt that she was wheezing.  Patient denied chest pain, abdominal pain, nausea/vomiting, changes in vision, headache, neck pain, sick contacts, dysuria, fevers  Home Medications Prior to Admission medications   Medication Sig Start Date End Date Taking? Authorizing Provider  doxycycline (VIBRAMYCIN) 100 MG capsule Take 1 capsule (100 mg total) by mouth 2 (two) times daily. 12/14/22  Yes Jas Betten, Florene Route, PA-C  acetaminophen (TYLENOL) 500 MG tablet Take 500 mg by mouth every 8 (eight) hours as needed for mild pain.    [provider]  albuterol (VENTOLIN HFA) 108 (90 Base) MCG/ACT inhaler Inhale 1-2 puffs into the lungs every 6 (six) hours as needed for wheezing or shortness of breath. 12/14/22   Chuck Hint, PA-C  Iron Polysacch Cmplx-B12-FA 150-0.025-1 MG CAPS Take 1 capsule by mouth every other day. 09/20/21   Shelly Bombard, MD  Norethindrone-Ethinyl Estradiol-Fe Biphas (LO LOESTRIN FE) 1 MG-10 MCG / 10 MCG tablet Take 1 tablet by mouth daily. 07/18/22   Laury Deep, CNM  predniSONE (STERAPRED UNI-PAK 21 TAB) 10 MG (21) TBPK tablet Take by mouth daily. Take 6 tabs by mouth daily  for 2 days, then 5 tabs for 2 days, then 4 tabs for 2 days, then 3 tabs for 2 days, 2 tabs for 2 days, then 1 tab by mouth daily for 2 days 12/14/22   Chuck Hint, PA-C  Prenatal Vit-Fe Fumarate-FA (PRENATAL VITAMIN) 27-0.8 MG TABS Take 1 tablet by mouth daily. 02/08/21   Gladys Damme, MD   SYMBICORT 160-4.5 MCG/ACT inhaler Inhale 2 puffs into the lungs 2 (two) times daily. 05/15/22   Atway, Jeananne Rama, DO      Allergies    Codeine and Motrin [ibuprofen]    Review of Systems   Review of Systems  Respiratory:  Positive for shortness of breath.   See HPI  Physical Exam Updated Vital Signs BP 121/87 (BP Location: Right Arm)   Pulse 98   Temp 98.5 F (36.9 C) (Oral)   Resp 18   Ht '5\' 6"'$  (1.676 m)   Wt 113.4 kg   SpO2 97%   BMI 40.35 kg/m  Physical Exam Vitals reviewed.  Constitutional:      General: She is not in acute distress. HENT:     Head: Normocephalic and atraumatic.  Eyes:     Extraocular Movements: Extraocular movements intact.     Conjunctiva/sclera: Conjunctivae normal.     Pupils: Pupils are equal, round, and reactive to light.  Cardiovascular:     Rate and Rhythm: Normal rate and regular rhythm.     Pulses: Normal pulses.     Heart sounds: Normal heart sounds.     Comments: 2+ bilateral radial/dorsalis pedis pulses with regular rate Pulmonary:     Effort: Pulmonary effort is normal. No respiratory distress.     Breath sounds: Normal breath sounds.  Abdominal:     Palpations: Abdomen is soft.  Tenderness: There is no abdominal tenderness. There is no guarding or rebound.  Musculoskeletal:        General: Normal range of motion.     Cervical back: Normal range of motion and neck supple.     Comments: 5 out of 5 bilateral grip/leg extension strength  Skin:    General: Skin is warm and dry.     Capillary Refill: Capillary refill takes less than 2 seconds.  Neurological:     General: No focal deficit present.     Mental Status: She is alert and oriented to person, place, and time.     Comments: Sensation intact in all 4 limbs  Psychiatric:        Mood and Affect: Mood normal.     ED Results / Procedures / Treatments   Labs (all labs ordered are listed, but only abnormal results are displayed) Labs Reviewed  BASIC METABOLIC PANEL -  Abnormal; Notable for the following components:      Result Value   CO2 21 (*)    All other components within normal limits  CBC - Abnormal; Notable for the following components:   WBC 12.4 (*)    Hemoglobin 10.1 (*)    HCT 33.0 (*)    MCV 69.5 (*)    MCH 21.3 (*)    RDW 19.4 (*)    All other components within normal limits  RESP PANEL BY RT-PCR (RSV, FLU A&B, COVID)  RVPGX2  GROUP A STREP BY PCR  HCG, SERUM, QUALITATIVE    EKG None  Radiology DG Chest Portable 1 View  Result Date: 12/14/2022 CLINICAL DATA:  One day history of shortness of breath associated with intermittent cough EXAM: PORTABLE CHEST 1 VIEW COMPARISON:  Chest radiograph dated 08/23/2021 FINDINGS: Normal lung volumes. Bibasilar patchy opacities. No pleural effusion or pneumothorax. Mildly enlarged cardiomediastinal silhouette. The visualized skeletal structures are unremarkable. IMPRESSION: 1. Bibasilar patchy opacities may reflect atelectasis or pneumonia. 2. Mildly enlarged cardiomediastinal silhouette may be projectional related to AP technique. Electronically Signed   By: Darrin Nipper M.D.   On: 12/14/2022 15:36    Procedures Procedures    Medications Ordered in ED Medications  albuterol (VENTOLIN HFA) 108 (90 Base) MCG/ACT inhaler 2 puff (2 puffs Inhalation Given 12/14/22 1509)  magnesium sulfate IVPB 2 g 50 mL (2 g Intravenous New Bag/Given 12/14/22 1620)  albuterol (PROVENTIL) (2.5 MG/3ML) 0.083% nebulizer solution 5 mg (5 mg Nebulization Given 12/14/22 1509)  ipratropium-albuterol (DUONEB) 0.5-2.5 (3) MG/3ML nebulizer solution 3 mL (3 mLs Nebulization Given 12/14/22 1543)  albuterol (PROVENTIL) (2.5 MG/3ML) 0.083% nebulizer solution 2.5 mg (2.5 mg Nebulization Given 12/14/22 1543)  methylPREDNISolone sodium succinate (SOLU-MEDROL) 125 mg/2 mL injection 125 mg (125 mg Intravenous Given 12/14/22 1618)  sodium chloride 0.9 % bolus 1,000 mL (1,000 mLs Intravenous New Bag/Given 12/14/22 1614)    ED Course/ Medical Decision  Making/ A&P                             Medical Decision Making Amount and/or Complexity of Data Reviewed Labs: ordered. Radiology: ordered.  Risk Prescription drug management.   Phyllis Christian 22 y.o. presented today for shortness of breath. Working DDx that I considered at this time includes, but not limited to, asthma exacerbation, URI, pneumonia.  Review of prior external notes: 09/01/2022 ED  Unique Tests and My Interpretation:  BMP: Unremarkable hCG serum qualitative: Negative Group A strep: Not detected CBC no leukocytosis 12.4  Respiratory panel: Negative Chest x-ray: Signs of consolidation bilaterally EKG: Sinus rhythm at 84 bpm, no ST abnormalities or blocks noted  Discussion with Independent Historian: None  Discussion of Management of Tests: None  Risk:   Medium:  - prescription drug management  Risk Stratification Score: None  R/o DDx: URI: Respiratory panel negative  Plan: Patient presented for shortness of breath.  On exam patient receiving a DuoNeb treatment from RT and labs and already been drawn.  Patient was not in any distress and had stable vitals at this time.  Patient will be given Solu-Medrol along with magnesium and fluids.  Will reassess.  On recheck I was able to obtain a history and conducted a physical exam.  Physical exam was unremarkable and patient was receiving fluids along with magnesium and the Solu-Medrol.  Patient had a white count along with consolidations suspicious of pneumonia on her chest x-ray.  I suspect patient has pneumonia that exacerbated her asthma and patient be treated with antibiotics.  The plan is to reassess the patient after she has received more fluid and refill her albuterol inhaler, give her steroid taper, doxycycline for the suspected pneumonia, and have her follow-up with her primary care provider regarding recent symptoms.  Patient stable at this time.  After speaking with the patient patient states that she feels  stable to go home.  Patient be given the antibiotics along with a steroid taper and a refilled albuterol inhaler.  Patient was encouraged to follow-up with her primary care provider and to monitor symptoms.  Patient was given return precautions.patient stable for discharge at this time.  Patient verbalized understanding of plan.         Final Clinical Impression(s) / ED Diagnoses Final diagnoses:  Moderate asthma with exacerbation, unspecified whether persistent  Pneumonia of both lungs due to infectious organism, unspecified part of lung    Rx / DC Orders ED Discharge Orders          Ordered    predniSONE (STERAPRED UNI-PAK 21 TAB) 10 MG (21) TBPK tablet  Daily        12/14/22 1653    doxycycline (VIBRAMYCIN) 100 MG capsule  2 times daily        12/14/22 1653    albuterol (VENTOLIN HFA) 108 (90 Base) MCG/ACT inhaler  Every 6 hours PRN        12/14/22 1653              Chuck Hint, PA-C 12/14/22 1656    Tretha Sciara, MD 12/18/22 1511

## 2022-12-14 NOTE — Discharge Instructions (Addendum)
Please pick up the albuterol inhaler I have prescribed for you along with the steroid medication.  Please call your primary care provider or the primary care provider I provided for you to make an appointment the next 2 days to be reevaluated regarding recent ER visit and symptoms.  If symptoms worsen please return to ER.

## 2022-12-14 NOTE — ED Triage Notes (Signed)
Patient here POV from Home.  Endorses SOB that began yesterday. History of Asthma. Utilized Last few Puffs of Inhaler today with mild relief.   Some Cough. Mostly Dry. Some Sore Throat. No Fever.  NAD Noted during Triage. A&Ox4. Gcs 15. Ambulatory.

## 2022-12-14 NOTE — ED Notes (Signed)
Discharge instructions, follow up care, and prescriptions reviewed and explained, pt verbalized understanding and had no further questions on d/c.

## 2022-12-17 NOTE — Progress Notes (Incomplete)
CC: ***  HPI:   Phyllis Christian is a 22 y.o. female with a past medical history of obesity, asthma, and sickle cell trait who presents for routine follow-up. She was last seen at [facility] in 05-2022. Visited the ED on 3-9 for acute asthma exacerbation, chest radiograph concerning for pneumonia, and discharged with albuterol inhaler refills plus steroid taper and doxycycline course.   URGENT CARE VISIT 2-12 Moderate asthma with exacerbation, unspecified whether persistent - ipratropium-albuterol (Duo-Neb) 0.5-2.5 mg/3 mL nebulizer solution 3 mL - albuterol (ProAir HFA) 108 (90 Base) MCG/ACT inhaler; Inhale 2 puffs every 6 (six) hours if needed for wheezing. - montelukast (Singulair) 10 MG tablet; Take 1 tablet (10 mg total) by mouth 1 (one) time each day.    Encounter for pregnancy test Patient presented to clinic to establish care with worries about pregnancy. Patient has had regular periods since she had her last delivery in December 2022. She is not planning to have more children in the near future, but was having unprotected sex with her long term partner and father of child. Of note, patient reports she has never had a pap smear or OB check up after her delivery in December. Today, patient was offered a pap smear and she declined, preferring it to be done at a later time.  Urine test in clinic was negative. Patient is still concerned about pregnancy. Patient was offered the option to be referred to OBGYN to follow up for her potential pregnancy. She was also advised to follow up for cervical cancer screening and for birth control if she is not planning a pregnancy in the near future. Patient was encouraged to come back to [facility] clinic if not pregnant to follow care.  Xxx Pap smear performed by ObGyn in 07-2022 was normal     Mild intermittent asthma without complication Patient reports she has not been using her recue inhaler as often as when she was pregnant. She still uses it <4 times a week.  Continues to use Symbicort daily. Denies exacerbations in the past few months. Patient endorses tobacco use. No respiratory wheezes today and clear to auscultation bilaterally. Patient was encouraged to stop smoking and refill of Symbicort was sent to the pharmacy.  Xxx Using budesonide-formoterol daily ? Adherence poor per chart . . . . Given ipratropium-albuterol at urgent care visit on 2-12, was this helpful ? How often using albuterol ? Breath shortness, exertional dyspnea, wheezing, exacerbations ? Visited ED in 08-2022, 11-2022, and then on 3-9 Smoking currently ? How much = x Completed steroid and doxycycline course ?     Elevated blood pressure reading Patient with elevated blood pressure today. Patient with history of gestational hypertension, previously on medication. Patient denies taking medications "for a long time". She has not had follow ups with PCP or OB after delivery. Denies HA, changes in vision, chest pain, or SOB or DOE. Patient denies hx of snoring. Recommended smoking cessation, diet modification and increase exercise for cardiovascular endurance and weight loss  Xxx Weight on 3-9 was 113.4kg Exercising currently ? Dietary changes ? BP clinic = x lightheadedness, dizziness, blurry vision, headache, anxiety, chest pain, palpitations      Shoulder pain [name] reported a 3 day history of shoulder pain, 2/10 that she noted after waking up. The pain does not radiate and it does not increase with moving her R arm. She has not tried over the counter pain medications. There is no point tenderness on spine or joint on exam. Full  range on motion without pain. Muscle tension noted along scapula. - Instructed patient to try warm packs/heat to ease tension - Suggested massages - If pain, add topical pain medications. No orals given potential pregnancy  Xxx Still present ?     Obesity, Class III, BMI 40-49.9 (morbid obesity) ([place]) Patient encouraged to pursue  lifestyle modifications as her current weight exacerbates her current comorbities and puts her a risk of metabolic disease. Will continue to monitor and explore management at follow up.   Anemia Patient with history of anemia. Previously on iron supplementation but reports she has not been taking it for a very long time. Last Hgb 8.9. Patient ROS was unremarkable and patient denies overt signs of bleeding. Will repeat cbc and iron studies today to check her current status and guide management. - CBC - Ferritin and Iron studies today = consistent with iron deficiency anemia  Xxx Fatigue, lightheadedness, dizziness, lethargy, vaginal bleeding, hematochezia, melena, hematemesis Hgb 10.1 on 3-9 Taking iron supplements still ? Should probably take them every other day . . . 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  General:   awake and alert, sitting comfortably in chair, cooperative, not in acute distress Skin:   warm and dry, intact without any obvious lesions or scars, no rashes Head:   normocephalic and atraumatic, oral mucosa moist with good dentition, no lymphadenopathy Eyes:   extraocular movements intact, conjunctivae pink, pupils round and reactive to light, no periorbital swelling or scleral icterus Ears:   pinnae normal, no discharge or external lesions  Nose:   symmetrical and without mucosal inflammation, no external lesions or discharge Lungs:   normal respiratory effort, breathing unlabored, symmetrical chest rise, no crackles or wheezing Cardiac:   regular rate and rhythm, normal S1 and S2, capillary refill 2-3 seconds, dorsalis  pedis pulses intact bilaterally, no pitting edema Abdomen:   soft and non-distended, normoactive bowel sounds present in all four quadrants, no guarding or palpable masses Musculoskeletal:   full range of motion in joints, motor strength 5 /5 in all four extremities, no obvious deformities or joint tenderness Neurologic:   oriented to person-place-time, moving all extremities, sensation to light touch intact, no gross focal deficits Psychiatric:   euthymic mood with congruent affect, intelligible speech    Assessment & Plan:   No problem-specific Assessment & Plan notes found for this encounter.     See Encounters Tab for problem based charting.  Patient {GC/GE:[id number]::"discussed with","seen with"} Dr. {NAMES:[id number]::"Guilloud","Hoffman","Mullen","Narendra","Williams","Vincent"}

## 2022-12-19 ENCOUNTER — Ambulatory Visit (INDEPENDENT_AMBULATORY_CARE_PROVIDER_SITE_OTHER): Payer: Medicaid Other | Admitting: Student

## 2022-12-19 DIAGNOSIS — F17210 Nicotine dependence, cigarettes, uncomplicated: Secondary | ICD-10-CM | POA: Diagnosis not present

## 2022-12-19 DIAGNOSIS — J4520 Mild intermittent asthma, uncomplicated: Secondary | ICD-10-CM

## 2022-12-19 MED ORDER — SYMBICORT 160-4.5 MCG/ACT IN AERO: 2.0000 | INHALATION_SPRAY | Freq: Two times a day (BID) | RESPIRATORY_TRACT | 3 refills | Status: DC

## 2022-12-19 NOTE — Patient Instructions (Addendum)
It was a pleasure seeing you in clinic today  Your lungs are sounding better and I am glad you are feeling better from pneumonia. You can stop taking the antibiotic doxycycline I have refilled your Symbicort please continue using this 2 puffs twice daily You can use your Symbicort or albuterol as needed for shortness of breath in addition to maintenance medication  Please pick up a refill of your birth control medication, if you have difficulty picking this up please call us and we can send a refill  Follow-up in about 6 months

## 2022-12-20 NOTE — Progress Notes (Signed)
Established Patient Office Visit  Subjective   Patient ID: Phyllis Christian, female    DOB: 11/09/2000  Age: 22 y.o. MRN: [id number]  Chief Complaint  Patient presents with   Medication Refill   Asthma    Phyllis Christian is a 22 y.o. person living with a history listed below who presents to clinic for follow up of asthma exacerbation. Please refer to problem based charting for further details and assessment and plan of current problem and chronic medical conditions.      Patient Active Problem List   Diagnosis Date Noted   Elevated blood pressure reading 05/16/2022   Obesity, Class III, BMI 40-49.9 (morbid obesity) ([place]) 05/16/2022   Anemia 05/16/2022   Encounter for pregnancy test 05/15/2022   Shoulder pain 05/15/2022   Encounter for post surgical wound check 10/04/2021   Gestational hypertension 09/26/2021   Echogenic intracardiac focus of fetus on prenatal ultrasound 06/20/2021   Maternal obesity affecting pregnancy, antepartum 04/17/2021   Supervision of other normal pregnancy, antepartum 02/08/2021   Obstructive sleep apnea 08/04/2015   Mild intermittent asthma without complication [id number]   Sickle cell trait ([place])       Review of Systems  Constitutional:  Negative for chills and fever.  HENT:  Negative for congestion, sinus pain and sore throat.   Respiratory:  Negative for shortness of breath and wheezing.   Cardiovascular:  Negative for chest pain.  All other systems reviewed and are negative.     Objective:     BP 125/64 (BP Location: Right Arm, Patient Position: Sitting, Cuff Size: Large)   Pulse 88   Temp 98.2 F (36.8 C) (Oral)   Ht 5\' 6"  (1.676 m)   Wt 285 lb 4.8 oz (129.4 kg)   SpO2 99%   BMI 46.05 kg/m  BP Readings from Last 3 Encounters:  12/19/22 125/64  12/14/22 (!) 147/73  09/01/22 133/84      Physical Exam Constitutional:      General: She is not in acute distress.    Appearance: Normal appearance.     Comments: Well appering  HENT:      Nose: Congestion present.     Mouth/Throat:     Mouth: Mucous membranes are moist.     Pharynx: Oropharynx is clear.  Eyes:     Extraocular Movements: Extraocular movements intact.     Pupils: Pupils are equal, round, and reactive to light.  Cardiovascular:     Rate and Rhythm: Normal rate and regular rhythm.     Pulses: Normal pulses.  Pulmonary:     Effort: Pulmonary effort is normal.     Breath sounds: Wheezing (right upper lung fields, intermittent) present.     Comments: No crackles Abdominal:     General: Abdomen is flat. Bowel sounds are normal. There is no distension.     Palpations: Abdomen is soft.     Tenderness: There is no abdominal tenderness.  Musculoskeletal:        General: Normal range of motion.     Right lower leg: No edema.     Left lower leg: No edema.  Skin:    General: Skin is warm and dry.     Capillary Refill: Capillary refill takes less than 2 seconds.  Neurological:     General: No focal deficit present.     Mental Status: She is alert and oriented to person, place, and time.  Psychiatric:        Mood and Affect: Mood normal.  Behavior: Behavior normal.       Assessment & Plan:   Problem List Items Addressed This Visit       Respiratory   Mild intermittent asthma without complication (Chronic)    Here for follow-up of asthma exacerbation and pneumonia.  Was evaluated at [facility] ED on 12/14/2022.  States she is feeling much better since then.  States her whole family has been having cold.  She has been taking doxycycline for the past 5 days for pneumonia.  Did not take prednisone.  Is not requiring her albuterol.  She is however out of Symbicort.  Reports good control of her asthma on maintenance inhaler with Symbicort.  Reviewing her chest x-ray appears she has patchy bilateral infiltrates consistent with viral pneumonia.  Likely asthma exacerbation in the setting of viral illness.  Will have her discontinue doxycycline.  Symbicort  refilled.       Relevant Medications   SYMBICORT 160-4.5 MCG/ACT inhaler    Return in about 6 months (around 06/21/2023).    Iona Beard, MD

## 2022-12-20 NOTE — Progress Notes (Signed)
Internal Medicine Clinic Attending ? ?Case discussed with Dr. Liang  At the time of the visit.  We reviewed the resident?s history and exam and pertinent patient test results.  I agree with the assessment, diagnosis, and plan of care documented in the resident?s note. ? ?

## 2022-12-20 NOTE — Assessment & Plan Note (Signed)
Here for follow-up of asthma exacerbation and pneumonia.  Was evaluated at [place] ED on 12/14/2022.  States she is feeling much better since then.  States her whole family has been having cold.  She has been taking doxycycline for the past 5 days for pneumonia.  Did not take prednisone.  Is not requiring her albuterol.  She is however out of Symbicort.  Reports good control of her asthma on maintenance inhaler with Symbicort.  Reviewing her chest x-ray appears she has patchy bilateral infiltrates consistent with viral pneumonia.  Likely asthma exacerbation in the setting of viral illness.  Will have her discontinue doxycycline.  Symbicort refilled.

## 2023-01-08 ENCOUNTER — Emergency Department (HOSPITAL_BASED_OUTPATIENT_CLINIC_OR_DEPARTMENT_OTHER): Payer: Medicaid Other | Admitting: Radiology

## 2023-01-08 ENCOUNTER — Emergency Department (HOSPITAL_BASED_OUTPATIENT_CLINIC_OR_DEPARTMENT_OTHER)
Admission: EM | Admit: 2023-01-08 | Discharge: 2023-01-08 | Disposition: A | Discharge status: Home / Self Care | Payer: Medicaid Other | Attending: Emergency Medicine | Admitting: Emergency Medicine

## 2023-01-08 ENCOUNTER — Encounter (HOSPITAL_BASED_OUTPATIENT_CLINIC_OR_DEPARTMENT_OTHER): Payer: Self-pay

## 2023-01-08 ENCOUNTER — Other Ambulatory Visit: Payer: Self-pay

## 2023-01-08 DIAGNOSIS — Z7951 Long term (current) use of inhaled steroids: Secondary | ICD-10-CM | POA: Insufficient documentation

## 2023-01-08 DIAGNOSIS — J020 Streptococcal pharyngitis: Secondary | ICD-10-CM | POA: Insufficient documentation

## 2023-01-08 DIAGNOSIS — J029 Acute pharyngitis, unspecified: Secondary | ICD-10-CM | POA: Diagnosis present

## 2023-01-08 DIAGNOSIS — Z1152 Encounter for screening for COVID-19: Secondary | ICD-10-CM | POA: Insufficient documentation

## 2023-01-08 DIAGNOSIS — J45909 Unspecified asthma, uncomplicated: Secondary | ICD-10-CM | POA: Insufficient documentation

## 2023-01-08 DIAGNOSIS — R0602 Shortness of breath: Secondary | ICD-10-CM | POA: Diagnosis not present

## 2023-01-08 LAB — RESP PANEL BY RT-PCR (RSV, FLU A&B, COVID)  RVPGX2
Influenza A by PCR: NEGATIVE
Influenza B by PCR: NEGATIVE
Resp Syncytial Virus by PCR: NEGATIVE
SARS Coronavirus 2 by RT PCR: NEGATIVE

## 2023-01-08 LAB — GROUP A STREP BY PCR: Group A Strep by PCR: DETECTED — AB

## 2023-01-08 MED ORDER — DEXAMETHASONE 10 MG/ML FOR PEDIATRIC ORAL USE
10.0000 mg | Freq: Once | INTRAMUSCULAR | Status: AC
  Administered 2023-01-08: 10 mg via ORAL
  Filled 2023-01-08: qty 1

## 2023-01-08 MED ORDER — DEXAMETHASONE 1 MG/ML PO CONC
10.0000 mg | Freq: Once | ORAL | Status: DC
  Filled 2023-01-08: qty 10

## 2023-01-08 MED ORDER — OXYCODONE-ACETAMINOPHEN 5-325 MG PO TABS: 1.0000 | ORAL_TABLET | Freq: Three times a day (TID) | ORAL | 0 refills | Status: DC | PRN

## 2023-01-08 MED ORDER — PENICILLIN G BENZATHINE 1200000 UNIT/2ML IM SUSY
1.2000 10*6.[IU] | PREFILLED_SYRINGE | Freq: Once | INTRAMUSCULAR | Status: AC
  Administered 2023-01-08: 1.2 10*6.[IU] via INTRAMUSCULAR
  Filled 2023-01-08: qty 2

## 2023-01-08 NOTE — Progress Notes (Signed)
RT listened to the Pt's breath sounds and they were clear. RT will continue to monitor

## 2023-01-08 NOTE — ED Provider Notes (Signed)
[facility] Provider Note   CSN: [id number] Arrival date & time: 01/08/23  1800     History  Chief Complaint  Patient presents with   Sore Throat    Phyllis Christian is a 22 y.o. female.   Sore Throat  Patient presents with sore throat.  Has had for the last few days.  Also shortness of breath and occasional cough.  Mild wheezing.  Some difficulty swallowing due to the pain.  No definite sick contacts.  Feels worse on the left side of her throat.    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 Authorizing Provider  oxyCODONE-acetaminophen (PERCOCET/ROXICET) 5-325 MG tablet Take 1-2 tablets by mouth every 8 (eight) hours as needed for severe pain. 01/08/23  Yes Davonna Belling, MD  acetaminophen (TYLENOL) 500 MG tablet Take 500 mg by mouth every 8 (eight) hours as needed for mild pain.    [provider]  albuterol (VENTOLIN HFA) 108 (90 Base) MCG/ACT inhaler Inhale 1-2 puffs into the lungs every 6 (six) hours as needed for wheezing or shortness of breath. 12/14/22   Chuck Hint, PA-C  doxycycline (VIBRAMYCIN) 100 MG capsule Take 1 capsule (100 mg total) by mouth 2 (two) times daily. 12/14/22   Chuck Hint, PA-C  Iron Polysacch Cmplx-B12-FA 150-0.025-1 MG CAPS Take 1 capsule by mouth every other day. 09/20/21   Shelly Bombard, MD  Norethindrone-Ethinyl Estradiol-Fe Biphas (LO LOESTRIN FE) 1 MG-10 MCG / 10 MCG tablet Take 1 tablet by mouth daily. 07/18/22   Laury Deep, CNM  Prenatal Vit-Fe Fumarate-FA (PRENATAL VITAMIN) 27-0.8 MG TABS Take 1 tablet by mouth daily. 02/08/21   Gladys Damme, MD  SYMBICORT 160-4.5 MCG/ACT inhaler Inhale 2 puffs into the lungs 2 (two) times daily.  12/19/22   Iona Beard, MD      Allergies    Codeine and Motrin [ibuprofen]    Review of Systems   Review of Systems  Physical Exam Updated Vital Signs BP 129/87   Pulse 87   Temp 99.2 F (37.3 C)   Resp 18   Ht 5\' 6"  (1.676 m)   Wt 129.4 kg   SpO2 100%   BMI 46.04 kg/m  Physical Exam Vitals and nursing note reviewed.  HENT:     Head: Normocephalic.     Mouth/Throat:     Mouth: Mucous membranes are moist.     Tonsils: Tonsillar exudate and tonsillar abscess present.  Cardiovascular:     Rate and Rhythm: Normal rate.  Pulmonary:     Breath sounds: No wheezing or rhonchi.  Abdominal:     Tenderness: There is no abdominal tenderness.  Neurological:     Mental Status: She is alert.     ED Results / Procedures / Treatments   Labs (all labs ordered are listed, but only abnormal results are displayed) Labs Reviewed  GROUP A STREP BY PCR - Abnormal; Notable for the following components:      Result Value   Group A Strep by PCR DETECTED (*)    All other components within normal limits  RESP PANEL BY RT-PCR (RSV, FLU A&B, COVID)  RVPGX2  EKG None  Radiology DG Chest 2 View  Result Date: 01/08/2023 CLINICAL DATA:  Shortness of breath. EXAM: CHEST - 2 VIEW COMPARISON:  Chest radiograph dated 12/14/2022. FINDINGS: No focal consolidation, pleural effusion or pneumothorax. The cardiac silhouette is within normal limits. No acute osseous pathology. IMPRESSION: No active cardiopulmonary disease. Electronically Signed   By: Anner Crete M.D.   On: 01/08/2023 19:17    Procedures Procedures    Medications Ordered in ED Medications  penicillin g benzathine (BICILLIN LA) 1200000 UNIT/2ML injection 1.2 Million Units (1.2 Million Units Intramuscular Given 01/08/23 1954)  dexamethasone (DECADRON) 10 MG/ML injection for Pediatric ORAL use 10 mg (10 mg Oral Given 01/08/23 1953)    ED Course/ Medical Decision Making/ A&P                             Medical Decision  Making Amount and/or Complexity of Data Reviewed Radiology: ordered.  Risk Prescription drug management.   Patient with sore throat.  Left-sided.  Has nearly kissing tonsils but doubt peritonsillar abscess.  Will treat with steroids and IM penicillin.  Discharged with pain medicine.  Well-appearing.       Final Clinical Impression(s) / ED Diagnoses Final diagnoses:  Strep pharyngitis    Rx / DC Orders ED Discharge Orders          Ordered    oxyCODONE-acetaminophen (PERCOCET/ROXICET) 5-325 MG tablet  Every 8 hours PRN        01/08/23 2038              Davonna Belling, MD 01/08/23 2324

## 2023-01-08 NOTE — ED Notes (Signed)
RN reviewed discharge instructions with pt. Pt verbalized understanding and had no further questions. VSS upon discharge.  

## 2023-01-08 NOTE — ED Triage Notes (Signed)
Patient here POV from Home.  Endorses Sore Throat that began a few days ago. Began to have SOB related to Asthma today and some last PM. Utilized her Inhaler today with some Mild Relief.  No Known fevers. Some Cough.   NAD Noted during Triage. A&Ox4. GCS 15. Ambulatory.

## 2023-01-08 NOTE — Discharge Instructions (Addendum)
Follow-up with the ER primary care doctor symptoms.  Return for more difficulty swallowing.

## 2023-07-28 ENCOUNTER — Ambulatory Visit (INDEPENDENT_AMBULATORY_CARE_PROVIDER_SITE_OTHER): Payer: Medicaid Other | Admitting: Internal Medicine

## 2023-07-28 ENCOUNTER — Encounter: Payer: Self-pay | Admitting: Internal Medicine

## 2023-07-28 ENCOUNTER — Other Ambulatory Visit: Payer: Self-pay

## 2023-07-28 VITALS — BP 128/69 | HR 63 | Temp 98.8°F | Ht 66.0 in | Wt 258.8 lb

## 2023-07-28 DIAGNOSIS — D649 Anemia, unspecified: Secondary | ICD-10-CM | POA: Diagnosis not present

## 2023-07-28 DIAGNOSIS — N921 Excessive and frequent menstruation with irregular cycle: Secondary | ICD-10-CM | POA: Diagnosis not present

## 2023-07-28 DIAGNOSIS — J4520 Mild intermittent asthma, uncomplicated: Secondary | ICD-10-CM

## 2023-07-28 DIAGNOSIS — Z1159 Encounter for screening for other viral diseases: Secondary | ICD-10-CM

## 2023-07-28 DIAGNOSIS — Z30011 Encounter for initial prescription of contraceptive pills: Secondary | ICD-10-CM

## 2023-07-28 LAB — POCT URINE PREGNANCY: Preg Test, Ur: NEGATIVE

## 2023-07-28 MED ORDER — NORETHIN-ETH ESTRAD-FE BIPHAS 1 MG-10 MCG / 10 MCG PO TABS: 1.0000 | ORAL_TABLET | Freq: Every day | ORAL | 11 refills | Status: AC

## 2023-07-28 MED ORDER — SYMBICORT 160-4.5 MCG/ACT IN AERO: 2.0000 | INHALATION_SPRAY | Freq: Two times a day (BID) | RESPIRATORY_TRACT | 3 refills | Status: DC

## 2023-07-28 NOTE — Progress Notes (Deleted)
This is a Psychologist, occupational Note.  The care of the patient was discussed with Dr. Marland Kitchen and the assessment and plan was formulated with their assistance.  Please see their note for official documentation of the patient encounter.   Subjective:   Patient ID: Phyllis Christian female   DOB: 2000-11-08 22 y.o.   MRN: [id number]  HPI: [name] is a 22 y.o. presenting for medication refill and routine followup. 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     albuterol (VENTOLIN HFA) 108 (90 Base) MCG/ACT inhaler Inhale 1-2 puffs into the lungs every 6 (six) hours as needed for wheezing or shortness of breath. 1 each 0   doxycycline (VIBRAMYCIN) 100 MG capsule Take 1 capsule (100 mg total) by mouth 2 (two) times daily. 20 capsule 0   Iron Polysacch Cmplx-B12-FA 150-0.025-1 MG CAPS Take 1 capsule by mouth every other day. 30 capsule 5   Norethindrone-Ethinyl Estradiol-Fe Biphas (LO LOESTRIN FE) 1 MG-10 MCG / 10 MCG tablet Take 1 tablet by mouth daily. 28 tablet 11   oxyCODONE-acetaminophen (PERCOCET/ROXICET) 5-325 MG tablet Take 1-2 tablets by mouth every 8 (eight) hours as needed for severe pain. 6 tablet 0   Prenatal Vit-Fe Fumarate-FA (PRENATAL VITAMIN) 27-0.8 MG TABS Take 1 tablet by mouth daily. 30 tablet 10   SYMBICORT 160-4.5 MCG/ACT inhaler Inhale 2 puffs into the lungs 2 (two) times daily. 1 each 3   No current facility-administered medications for this visit.   Family History  Problem Relation Age of Onset   Diabetes Maternal Grandfather    Obesity Mother    Diabetes  Mother    Asthma Father    Social History   Socioeconomic History   Marital status: Single    Spouse name: Not on file   Number of children: Not on file   Years of education: Not on file   Highest education level: Not on file  Occupational History   Not on file  Tobacco Use   Smoking status: Some Days    Types: Cigarettes, Cigars    Passive exposure: Yes   Smokeless tobacco: Never   Tobacco comments:    outside smoker  Vaping Use   Vaping status: Never Used  Substance and Sexual Activity   Alcohol use: No   Drug use: No   Sexual activity: Yes    Birth control/protection: None  Other Topics Concern   Not on file  Social History Narrative   Patient lives with Mom and Dad and two brothers. Per mom, both her and dad smoke but they mostly smoke outside. No pets in the home.   Social Determinants of Health   Financial Resource Strain: Not on file  Food Insecurity: Not on file  Transportation Needs: Not on file  Physical Activity: Not on file  Stress: Not on file  Social Connections: Not on file   Review of Systems: Pertinent items noted in HPI and remainder of comprehensive ROS otherwise negative.  Objective:  Physical Exam: There  were no vitals filed for this visit. BP 128/69 (BP Location: Right Arm, Patient Position: Sitting, Cuff Size: Large)   Pulse 63   Temp 98.8 F (37.1 C)   Ht 5\' 6"  (1.676 m)   Wt 258 lb 12.8 oz (117.4 kg)   SpO2 99%   BMI 41.77 kg/m   General appearance: alert, cooperative, and no distress Head: Normocephalic, without obvious abnormality, atraumatic Lungs: clear to auscultation bilaterally Heart: regular rate and rhythm, S1, S2 normal, no murmur, click, rub or gallop  Assessment & Plan:   Mild intermittent asthma without complication (Chronic)    CBC for previous anemia  History on menses for iron deficiency anemia  Why not taking birth control  History of anemia Hgb was 10.1 in 3/9 and has been intermittently low in the  past. Given irregular menses outlined above   Got flu shot today!   Asthma exacerbations - how many episodes, nocturnal awakenings, seasonality, interference with normal activities

## 2023-07-28 NOTE — Patient Instructions (Addendum)
Thank you, [name] for allowing [name] to provide your care today.   Irregular periods: For now I am checking your thyroid labs. I am concerned that this is related to not taking the birth control medications as prescribed. I am getting a pregnancy test today. If that is negative then please start birth control again and take daily. Follow-up in 1 month. If you are still having abnormal periods, there are additional studies I will order. If you are back to normal, we can revisit if you would want Nexplanon versus IUD.  Low blood count I am checking some labs for your blood count and iron. I will call with results.  I have ordered the following medication/changed the following medications:   Stop the following medications: Medications Discontinued During This Encounter  Medication Reason   doxycycline (VIBRAMYCIN) 100 MG capsule    albuterol (VENTOLIN HFA) 108 (90 Base) MCG/ACT inhaler    Prenatal Vit-Fe Fumarate-FA (PRENATAL VITAMIN) 27-0.8 MG TABS    Iron Polysacch Cmplx-B12-FA 150-0.025-1 MG CAPS    Norethindrone-Ethinyl Estradiol-Fe Biphas (LO LOESTRIN FE) 1 MG-10 MCG / 10 MCG tablet Reorder   SYMBICORT 160-4.5 MCG/ACT inhaler Reorder     Start the following medications: Meds ordered this encounter  Medications   SYMBICORT 160-4.5 MCG/ACT inhaler    Sig: Inhale 2 puffs into the lungs 2 (two) times daily.    Dispense:  30.6 g    Refill:  3   Norethindrone-Ethinyl Estradiol-Fe Biphas (LO LOESTRIN FE) 1 MG-10 MCG / 10 MCG tablet    Sig: Take 1 tablet by mouth daily.    Dispense:  28 tablet    Refill:  11     Follow up:  1 month    We look forward to seeing you next time. Please call our clinic at [phone] if you have any questions or concerns. The best time to call is Monday-Friday from 9am-4pm, but there is someone available 24/7. If after hours or the weekend, call the main hospital number and ask for the Internal Medicine Resident On-Call. If you need medication refills,  please notify your pharmacy one week in advance and they will send [name] a request.   Thank you for trusting me with your care. Wishing you the best!   Rudene Christians, DO [facility]

## 2023-07-28 NOTE — Progress Notes (Unsigned)
Subjective:  CC: irregular periods  HPI:  [name] is a 22 y.o. female with a past medical history stated below and presents today for irregular periods. Please see problem based assessment and plan for additional details.  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     No current facility-administered medications on file prior to visit.    Family History  Problem Relation Age of Onset   Diabetes Maternal Grandfather    Obesity Mother    Diabetes Mother    Asthma Father     Social History   Socioeconomic History   Marital status: Single    Spouse name: Not on file   Number of children: Not on file   Years of education: Not on file   Highest education level: Not on file  Occupational History   Not on file  Tobacco Use   Smoking status: Some Days    Types: Cigarettes, Cigars    Passive exposure: Yes   Smokeless tobacco: Never   Tobacco comments:    outside smoker  Vaping Use   Vaping status: Never Used  Substance and Sexual Activity   Alcohol use: No   Drug use: No   Sexual activity: Yes    Birth control/protection: None  Other Topics Concern   Not on file  Social History Narrative   Patient lives with Mom and Dad and two brothers. Per mom, both her and dad smoke but they mostly smoke outside. No pets in the home.   Social Determinants of Health   Financial Resource Strain: Not on file  Food Insecurity: Not on file  Transportation Needs: Not on file  Physical Activity: Not on file  Stress: Not on file  Social Connections: Not on file  Intimate Partner Violence: Not on file    Review of Systems: ROS negative except for what is noted on the  assessment and plan.  Objective:   Vitals:   07/28/23 1500  BP: 128/69  Pulse: 63  Temp: 98.8 F (37.1 C)  SpO2: 99%  Weight: 258 lb 12.8 oz (117.4 kg)  Height: 5\' 6"  (1.676 m)    Physical Exam: Constitutional: well-appearing, pale conjunctiva Cardiovascular: regular rate and rhythm, no m/r/g Pulmonary/Chest: normal work of breathing on room air, lungs clear to auscultation bilaterally Abdominal: soft, non-tender, non-distended MSK: normal bulk and tone  Assessment & Plan:  Menorrhagia Phyllis Christian reports that for the last 6 weeks, she has had a period every 2 weeks. Periods have been heavier than usual. Prior to that she had monthly periods that last about 5 days and she changed a pad every 3 hours.  She is prescribed OCPs but has not been taking consistently. She misses several days and then restarts pack. She is sexually active with one female and they do not use condoms. She is not concerned for STDs at this time. P: Urine pregnancy negative -TSH checked She is motivated to take OCPs daily, will have her do this for 1 month and see if periods become regular again. We talked about various birth control methods including nexplanon and IUDs since taking a pill daily is  difficult. She will do some research about both and we can revisit at f/u in 1 month. If irregular bleeding persists in one month then would continue lab evaluation with FSH. LH, prolactin, and estrogen  Addendum 10/22: Iron studies consistent with iron deficiency. Her Hgb is improved but MVC is a bit low at 78. I called and reviewed results with patient. She is going to pick up birth control meds today. I asked that she restart iron pills every other day as well. We will plan to recheck iron studies in 4-6 weeks.    Thyroids labs within normal limits, will plan to do additional blood work if periods remain irregular after starting birth control meds.   Anemia Hgb at 10.1 about 7 months ago. She was previously taking  iron supplement but hasn't in some time. She denies shortness of breath or fatigue.    Latest Ref Rng & Units 07/28/2023    3:45 PM 12/14/2022    2:57 PM 05/15/2022    4:52 PM  CBC  WBC 3.4 - 10.8 x10E3/uL 10.2  12.4  15.0   Hemoglobin 11.1 - 15.9 g/dL 16.1  09.6  04.5   Hematocrit 34.0 - 46.6 % 36.7  33.0  35.2   Platelets 150 - 450 x10E3/uL 283  308  336     P: Hgb improved on CBC, Iron studies, folate, B12 pending   Mild intermittent asthma without complication No asthma exacerbations in several years. She uses symbicort a couple of times monthly when she feels short of breath. No nocturnal awakenings. P: Smoking cessation discussed, she is pre-comtemplative Refilled symbicort Could consider PFT if symptoms worsen    Patient discussed with Dr. Precious Bard Anett Ranker, D.O. [facility] Internal [facility]  PGY-3 Pager: [phone]  Phone: [phone] Date 07/29/2023  Time 1:50 PM

## 2023-07-29 ENCOUNTER — Encounter: Payer: Self-pay | Admitting: Internal Medicine

## 2023-07-29 DIAGNOSIS — N920 Excessive and frequent menstruation with regular cycle: Secondary | ICD-10-CM | POA: Insufficient documentation

## 2023-07-29 LAB — CBC WITH DIFFERENTIAL/PLATELET
Basophils Absolute: 0 10*3/uL (ref 0.0–0.2)
Basos: 0 %
EOS (ABSOLUTE): 0.2 10*3/uL (ref 0.0–0.4)
Eos: 2 %
Hematocrit: 36.7 % (ref 34.0–46.6)
Hemoglobin: 11.4 g/dL (ref 11.1–15.9)
Immature Grans (Abs): 0.1 10*3/uL (ref 0.0–0.1)
Immature Granulocytes: 1 %
Lymphocytes Absolute: 3 10*3/uL (ref 0.7–3.1)
Lymphs: 30 %
MCH: 24.3 pg — ABNORMAL LOW (ref 26.6–33.0)
MCHC: 31.1 g/dL — ABNORMAL LOW (ref 31.5–35.7)
MCV: 78 fL — ABNORMAL LOW (ref 79–97)
Monocytes Absolute: 0.7 10*3/uL (ref 0.1–0.9)
Monocytes: 7 %
Neutrophils Absolute: 6.2 10*3/uL (ref 1.4–7.0)
Neutrophils: 60 %
Platelets: 283 10*3/uL (ref 150–450)
RBC: 4.7 x10E6/uL (ref 3.77–5.28)
RDW: 16.8 % — ABNORMAL HIGH (ref 11.7–15.4)
WBC: 10.2 10*3/uL (ref 3.4–10.8)

## 2023-07-29 LAB — HCV INTERPRETATION

## 2023-07-29 LAB — IRON,TIBC AND FERRITIN PANEL
Ferritin: 6 ng/mL — ABNORMAL LOW (ref 15–150)
Iron Saturation: 8 % — CL (ref 15–55)
Iron: 33 ug/dL (ref 27–159)
Total Iron Binding Capacity: 414 ug/dL (ref 250–450)
UIBC: 381 ug/dL (ref 131–425)

## 2023-07-29 LAB — FOLATE: Folate: 7.1 ng/mL (ref >3.0)

## 2023-07-29 LAB — VITAMIN B12: Vitamin B-12: 389 pg/mL (ref 232–1245)

## 2023-07-29 LAB — HCV AB W REFLEX TO QUANT PCR: HCV Ab: NONREACTIVE

## 2023-07-29 LAB — TSH: TSH: 1.46 u[IU]/mL (ref 0.450–4.500)

## 2023-07-29 NOTE — Assessment & Plan Note (Signed)
Hgb at 10.1 about 7 months ago. She was previously taking iron supplement but hasn't in some time. She denies shortness of breath or fatigue.    Latest Ref Rng & Units 07/28/2023    3:45 PM 12/14/2022    2:57 PM 05/15/2022    4:52 PM  CBC  WBC 3.4 - 10.8 x10E3/uL 10.2  12.4  15.0   Hemoglobin 11.1 - 15.9 g/dL 21.3  08.6  57.8   Hematocrit 34.0 - 46.6 % 36.7  33.0  35.2   Platelets 150 - 450 x10E3/uL 283  308  336     P: Hgb improved on CBC, Iron studies, folate, B12 pending

## 2023-07-29 NOTE — Assessment & Plan Note (Signed)
No asthma exacerbations in several years. She uses symbicort a couple of times monthly when she feels short of breath. No nocturnal awakenings. P: Smoking cessation discussed, she is pre-comtemplative Refilled symbicort Could consider PFT if symptoms worsen

## 2023-07-29 NOTE — Assessment & Plan Note (Addendum)
Phyllis Christian reports that for the last 6 weeks, she has had a period every 2 weeks. Periods have been heavier than usual. Prior to that she had monthly periods that last about 5 days and she changed a pad every 3 hours.  She is prescribed OCPs but has not been taking consistently. She misses several days and then restarts pack. She is sexually active with one female and they do not use condoms. She is not concerned for STDs at this time. P: Urine pregnancy negative -TSH checked She is motivated to take OCPs daily, will have her do this for 1 month and see if periods become regular again. We talked about various birth control methods including nexplanon and IUDs since taking a pill daily is difficult. She will do some research about both and we can revisit at f/u in 1 month. If irregular bleeding persists in one month then would continue lab evaluation with FSH. LH, prolactin, and estrogen  Addendum 10/22: Iron studies consistent with iron deficiency. Her Hgb is improved but MVC is a bit low at 78. I called and reviewed results with patient. She is going to pick up birth control meds today. I asked that she restart iron pills every other day as well. We will plan to recheck iron studies in 4-6 weeks.    Thyroids labs within normal limits, will plan to do additional blood work if periods remain irregular after starting birth control meds.

## 2023-08-01 NOTE — Addendum Note (Signed)
Addended by: Dickie La on: 08/01/2023 08:46 AM   Modules accepted: Level of Service

## 2023-08-01 NOTE — Progress Notes (Signed)
Internal Medicine Clinic Attending  Case discussed with the resident at the time of the visit.  We reviewed the resident's history and exam and pertinent patient test results.  I agree with the assessment, diagnosis, and plan of care documented in the resident's note.  Pregnancy test negative. Patient was menstruating at today's visit.

## 2023-09-26 ENCOUNTER — Ambulatory Visit: Payer: Medicaid Other | Admitting: Student

## 2023-09-26 VITALS — BP 122/77 | HR 87 | Temp 98.2°F | Ht 66.0 in | Wt 238.9 lb

## 2023-09-26 DIAGNOSIS — R1110 Vomiting, unspecified: Secondary | ICD-10-CM | POA: Diagnosis not present

## 2023-09-26 DIAGNOSIS — R051 Acute cough: Secondary | ICD-10-CM

## 2023-09-26 LAB — RESP PANEL BY RT-PCR (RSV, FLU A&B, COVID)  RVPGX2
Influenza A by PCR: NEGATIVE
Influenza B by PCR: NEGATIVE
Resp Syncytial Virus by PCR: NEGATIVE
SARS Coronavirus 2 by RT PCR: NEGATIVE

## 2023-09-26 NOTE — Progress Notes (Unsigned)
   CC: Acute Concern of cough  HPI:  Phyllis Christian is a 22 y.o. female with pertinent PMH of AUB, IDA, obesity, sickle cell trait, and asthma who presents to the clinic for 2 weeks of cough. Please see assessment and plan below for further details.  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  Physical Exam:  Vitals:   09/26/23 0846  BP: 122/77  Pulse: 87  Temp: 98.2 F (36.8 C)  TempSrc: Oral  SpO2: 96%  Weight: 238 lb 14.4 oz (108.4 kg)  Height: 5\' 6"  (1.676 m)    Constitutional: Mildly ill-appearing adult female. In no acute distress. HEENT: Normocephalic, atraumatic, Sclera non-icteric, PERRL, EOM intact Cardio:Regular rate and rhythm. Pulm: Coarse breath sounds bilaterally. Normal work of breathing on room air. Abdomen: Soft, non-tender, non-distended, positive bowel sounds. XTG:GYIRSWNI for extremity edema. Skin:Warm and dry. Neuro:Alert and oriented x3. No focal deficit noted. Psych:Pleasant mood and affect.   Assessment & Plan:   Acute cough Presents with 1 week of cough with posttussive emesis.  Cough began about 2 weeks ago and was initially productive of clear mucus but now has had less and less.  Over the past 3 days she has had posttussive emesis.  She also had a sore throat on the first few days that resolved.  Denies any fevers or shortness of breath.  No known sick contacts.  COVID, flu, RSV panel negative. - Continue supportive care and follow-up pertussis PCR    Patient discussed with Dr.  Theodosia Paling, DO [facility] Internal Medicine Resident PGY-2 Clinic Phone: [phone] Pager: [phone]

## 2023-09-26 NOTE — Patient Instructions (Addendum)
  Thank you, [name], for allowing [name] to provide your care today. Today we sent tests for flu, COVID, RSV, and pertussis.  Please [name] know if you are having any worsening symptoms and I will call you with results of these tests.  We will have you back in about 1 month to talk about and perform Nexplanon implantation.   I have ordered the following labs for you:  Lab Orders         Bordetella Pertussis PCR         Resp panel by RT-PCR (RSV, Flu A&B, Covid) Anterior Nasal Swab          Follow up:  1 month for Nexplanon     Remember:     Should you have any questions or concerns please call the internal medicine clinic at [phone].     Phyllis Morel, DO [facility]

## 2023-09-29 DIAGNOSIS — R051 Acute cough: Secondary | ICD-10-CM | POA: Insufficient documentation

## 2023-09-29 NOTE — Progress Notes (Signed)
Internal Medicine Clinic Attending  Case discussed with the resident at the time of the visit.  We reviewed the resident's history and exam and pertinent patient test results.  I agree with the assessment, diagnosis, and plan of care documented in the resident's note.  

## 2023-09-29 NOTE — Assessment & Plan Note (Signed)
Presents with 1 week of cough with posttussive emesis.  Cough began about 2 weeks ago and was initially productive of clear mucus but now has had less and less.  Over the past 3 days she has had posttussive emesis.  She also had a sore throat on the first few days that resolved.  Denies any fevers or shortness of breath.  No known sick contacts.  COVID, flu, RSV panel negative. - Continue supportive care and follow-up pertussis PCR

## 2023-09-30 LAB — BORDETELLA PERTUSSIS PCR
B. parapertussis DNA: NEGATIVE
B. pertussis DNA: NEGATIVE

## 2023-10-30 ENCOUNTER — Other Ambulatory Visit: Payer: Self-pay | Admitting: Student

## 2023-10-30 DIAGNOSIS — J4520 Mild intermittent asthma, uncomplicated: Secondary | ICD-10-CM

## 2023-11-03 ENCOUNTER — Ambulatory Visit: Payer: Medicaid Other | Admitting: Student

## 2023-11-03 ENCOUNTER — Encounter: Payer: Self-pay | Admitting: Student

## 2023-11-03 VITALS — BP 127/77 | HR 71 | Temp 98.4°F | Wt 239.2 lb

## 2023-11-03 DIAGNOSIS — N912 Amenorrhea, unspecified: Secondary | ICD-10-CM

## 2023-11-03 DIAGNOSIS — Z3201 Encounter for pregnancy test, result positive: Secondary | ICD-10-CM | POA: Diagnosis not present

## 2023-11-03 DIAGNOSIS — Z3200 Encounter for pregnancy test, result unknown: Secondary | ICD-10-CM

## 2023-11-03 LAB — POCT URINE PREGNANCY: Preg Test, Ur: POSITIVE — AB

## 2023-11-03 NOTE — Patient Instructions (Addendum)
Phyllis Christian,Thank you for allowing me to take part in your care today.  Here are your instructions.  1.  [facility]  Address: [address] [place], [place] [address] Phone: [phone]   Thank you, Dr. Allena Katz  If you have any other questions please contact the internal medicine clinic at [phone] If it is after hours, please call the [place] [facility] at [phone] and then ask the person who picks up for the resident on call.

## 2023-11-03 NOTE — Progress Notes (Signed)
   CC: Pregnancy test  HPI:  Phyllis Christian is a 23 y.o. female with a past medical history of gestational hypertension, asthma, sickle cell trait, history of elevated blood pressures who presents for follow-up appointment.  Please see assessment and plan for full HPI.  Medication: Asthma: Symbicort  Encounter for pregnancy test.  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     Current Outpatient Medications:    acetaminophen (TYLENOL) 500 MG tablet, Take 500 mg by mouth every 8 (eight) hours as needed for mild pain., Disp: , Rfl:    Norethindrone-Ethinyl Estradiol-Fe Biphas (LO LOESTRIN FE) 1 MG-10 MCG / 10 MCG tablet, Take 1 tablet by mouth daily., Disp: 28 tablet, Rfl: 11   SYMBICORT 160-4.5 MCG/ACT inhaler, INHALE 2 PUFFS INTO THE LUNGS TWICE A DAY, Disp: 10.2 each, Rfl: 3  Review of Systems:   GI: Patient endorses nausea  Physical Exam:  Vitals:   11/03/23 0916  BP: 127/77  Pulse: 71  Temp: 98.4 F (36.9 C)  TempSrc: Oral  SpO2: 99%  Weight: 239 lb 3.2 oz (108.5 kg)    General: Patient is sitting comfortably in the room  Cardio: Regular rate and rhythm, no murmurs, rubs or gallops Pulmonary: Clear to ausculation bilaterally with no rales, rhonchi, and crackles    Assessment & Plan:   Encounter for pregnancy test Patient presents today for evaluation for pregnancy test.  She states her last menstrual period was September 17, 2023 and has not had a menstrual period since.  She states that she is feeling nauseous in the morning.  She states that she took a pregnancy test at home which was positive.  This makes her 5 weeks and 5 days pregnant.  Today, she does have a positive pregnancy test.  She does not have a OB/GYN.  She denies taking any folate supplementation.  She does report tobacco use, which I counseled her on the importance of tobacco cessation while  pregnant.  Patient does report to me that she would like to talk about her options of termination of the pregnancy.  Did provide patient resources for this.  Patient counseled on the importance of folate supplementation and smoking cessation if patient does decide to keep pregnancy.  Plan: -[facility] contact [facility] provided -Counseled patient on the importance of folate supplementation and tobacco cessation  Patient discussed with Dr. Marquita Palms, DO PGY-2 Internal Medicine Resident  Pager: [phone]

## 2023-11-03 NOTE — Assessment & Plan Note (Signed)
Patient presents today for evaluation for pregnancy test.  She states her last menstrual period was September 17, 2023 and has not had a menstrual period since.  She states that she is feeling nauseous in the morning.  She states that she took a pregnancy test at home which was positive.  This makes her 5 weeks and 5 days pregnant.  Today, she does have a positive pregnancy test.  She does not have a OB/GYN.  She denies taking any folate supplementation.  She does report tobacco use, which I counseled her on the importance of tobacco cessation while pregnant.  Patient does report to me that she would like to talk about her options of termination of the pregnancy.  Did provide patient resources for this.  Patient counseled on the importance of folate supplementation and smoking cessation if patient does decide to keep pregnancy.  Plan: -[facility] contact information provided -Counseled patient on the importance of folate supplementation and tobacco cessation

## 2023-11-06 NOTE — Progress Notes (Signed)
Internal Medicine Clinic Attending  Case discussed with the resident at the time of the visit.  We reviewed the resident's history and exam and pertinent patient test results.  I agree with the assessment, diagnosis, and plan of care documented in the resident's note.

## 2023-12-11 ENCOUNTER — Encounter: Admitting: Student

## 2023-12-11 NOTE — Progress Notes (Deleted)
***       CC: {Clinic Visit Type:31040}  HPI:  Phyllis Christian is a 23 y.o. female with pertinent PMH of asthma, OSA, AUB, IDA, class II obesity, and sickle cell trait who presents as above. Please see assessment and plan below for further details.  Review of Systems:   Pertinent items noted in HPI and/or A&P.  Physical Exam:  There were no vitals filed for this visit.  Constitutional:***. In no acute distress. HEENT: Normocephalic, atraumatic, Sclera non-icteric, PERRL, EOM intact Cardio:Regular rate and rhythm. 2+ bilateral {PulseLoc:28294} pulses. Pulm:Clear to auscultation bilaterally. Normal work of breathing on room air. Abdomen: Soft, non-tender, non-distended, positive bowel sounds. ZOX:WRUEAVWU for extremity edema. Skin:Warm and dry. Neuro:Alert and oriented x3. No focal deficit noted. Psych:Pleasant mood and affect.   Assessment & Plan:   No problem-specific Assessment & Plan notes found for this encounter.    Patient {GC/GE:3044014::"discussed with","seen with"} Dr. {NAMES:3044014::"Lau","Guilloud","Hoffman","Machen","Chambliss","Winfrey","Williams","Vincent"}  Rocky Morel, DO [facility] Internal Medicine Resident PGY-2 Clinic Phone: [phone] Pager: [phone]

## 2023-12-25 ENCOUNTER — Encounter: Payer: Self-pay | Admitting: Student

## 2023-12-25 ENCOUNTER — Ambulatory Visit: Admitting: Student

## 2023-12-25 DIAGNOSIS — J302 Other seasonal allergic rhinitis: Secondary | ICD-10-CM | POA: Diagnosis not present

## 2023-12-25 DIAGNOSIS — Z3200 Encounter for pregnancy test, result unknown: Secondary | ICD-10-CM

## 2023-12-25 DIAGNOSIS — R051 Acute cough: Secondary | ICD-10-CM

## 2023-12-25 DIAGNOSIS — Z3202 Encounter for pregnancy test, result negative: Secondary | ICD-10-CM | POA: Diagnosis not present

## 2023-12-25 LAB — POCT URINE PREGNANCY: Preg Test, Ur: NEGATIVE

## 2023-12-25 MED ORDER — CETIRIZINE HCL 10 MG PO TABS: 10.0000 mg | ORAL_TABLET | Freq: Every day | ORAL | 2 refills | Status: AC

## 2023-12-25 NOTE — Addendum Note (Signed)
 Addended by: Katheran James on: 12/25/2023 05:02 PM   Modules accepted: Orders

## 2023-12-25 NOTE — Assessment & Plan Note (Signed)
 She reports ongoing dry, ticklish, intermittent cough over the last 3 months.  She reports no significant changes in her habits, exposures.  She has no symptoms concerning for URI and certainly not short of breath or hypoxic.  She notes that her seasonal allergies has been worse lately and she also reports that she 10 twice daily.  On top of this she has a history of well-controlled mild intermittent asthma and is taking Symbicort daily.  She is not having shortness of breath, nocturnal awakenings, or asthma attacks.  Overall I suspect that her cough is multifactorial, likely the influence of her marijuana and allergies on top of her underlying asthma. - For now I will start cetirizine 10 qd for allergies and encourage reduced marijuana - If persistent, could consider escalating her asthma therapy

## 2023-12-25 NOTE — Progress Notes (Signed)
   CC: Cough, pregnancy test  HPI:  Phyllis Christian is a 23 y.o. female with a PMH stated below who presents today for acute visit for evaluation of a persistent dry cough and with specific request for a pregnancy test.  Please see problem based assessment and plan for additional details.  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  There were no vitals filed for this visit.  Physical Exam: Constitutional: well-appearing woman in no acute distress Cardiovascular: regular rate and rhythm, no m/r/g Pulmonary/Chest: normal work of breathing on room air, lungs clear to auscultation bilaterally Abdominal: soft, non-tender, non-distended MSK: normal bulk and tone Neurological: alert & oriented x 3, no focal deficit Skin: warm and dry Psych: normal mood and behavior  Assessment & Plan:   Patient discussed with Dr. Antony Contras  Acute cough She reports ongoing dry, ticklish, intermittent cough over the last 3 months.  She reports no significant changes in her habits, exposures.  She has no symptoms concerning for URI and certainly not short of breath or hypoxic.  She notes that her seasonal allergies has been worse lately and she also reports that she 10 twice daily.  On top of this she has a history of well-controlled mild intermittent asthma and is taking Symbicort daily.  She is not having shortness of breath, nocturnal awakenings, or asthma attacks.  Overall I suspect that her cough is multifactorial, likely the influence of her marijuana and allergies on top of her underlying asthma. - For now I will start cetirizine 10 qd for allergies and encourage reduced marijuana - If persistent, could consider escalating her asthma therapy  Seasonal allergies See above. She relies on benadryl and I feel an  alternative would be better. - Cetirizine 10 daily  Encounter for pregnancy test She would like a pregnancy test today.  She notes that about 1 month ago she had an elective abortion her period has not returned.  She denies any bleeding, pelvic or abdominal pain, or other discharge and no fevers or chills.  She does not have urinary symptoms. - Pregnancy test today was negative  RTC in 6 months general checkup.  Katheran James, D.O. [facility] Internal Medicine, PGY-1 Phone: [phone] Date 12/25/2023 Time 4:54 PM

## 2023-12-25 NOTE — Assessment & Plan Note (Signed)
 She would like a pregnancy test today.  She notes that about 1 month ago she had an elective abortion her period has not returned.  She denies any bleeding, pelvic or abdominal pain, or other discharge and no fevers or chills.  She does not have urinary symptoms. - Pregnancy test today was negative

## 2023-12-25 NOTE — Assessment & Plan Note (Signed)
 See above. She relies on benadryl and I feel an alternative would be better. - Cetirizine 10 daily

## 2023-12-26 ENCOUNTER — Other Ambulatory Visit (INDEPENDENT_AMBULATORY_CARE_PROVIDER_SITE_OTHER)

## 2023-12-26 DIAGNOSIS — Z3200 Encounter for pregnancy test, result unknown: Secondary | ICD-10-CM

## 2023-12-26 DIAGNOSIS — I10 Essential (primary) hypertension: Secondary | ICD-10-CM | POA: Diagnosis not present

## 2023-12-26 NOTE — Addendum Note (Signed)
 Addended by: Bufford Spikes on: 12/26/2023 11:16 AM   Modules accepted: Orders

## 2023-12-26 NOTE — Addendum Note (Signed)
 Addended by: Katheran James on: 12/26/2023 08:28 AM   Modules accepted: Orders

## 2023-12-26 NOTE — Addendum Note (Signed)
 Addended by: Bufford Spikes on: 12/26/2023 11:17 AM   Modules accepted: Orders

## 2023-12-27 LAB — BETA HCG QUANT (REF LAB): hCG Quant: 2 m[IU]/mL

## 2023-12-28 ENCOUNTER — Emergency Department (HOSPITAL_COMMUNITY)

## 2023-12-28 ENCOUNTER — Other Ambulatory Visit: Payer: Self-pay

## 2023-12-28 ENCOUNTER — Encounter (HOSPITAL_COMMUNITY): Payer: Self-pay | Admitting: Emergency Medicine

## 2023-12-28 ENCOUNTER — Emergency Department (HOSPITAL_COMMUNITY)
Admission: EM | Admit: 2023-12-28 | Discharge: 2023-12-28 | Disposition: A | Discharge status: Home / Self Care | Attending: Emergency Medicine | Admitting: Emergency Medicine

## 2023-12-28 DIAGNOSIS — R220 Localized swelling, mass and lump, head: Secondary | ICD-10-CM | POA: Diagnosis not present

## 2023-12-28 DIAGNOSIS — K029 Dental caries, unspecified: Secondary | ICD-10-CM | POA: Diagnosis not present

## 2023-12-28 DIAGNOSIS — K0889 Other specified disorders of teeth and supporting structures: Secondary | ICD-10-CM | POA: Insufficient documentation

## 2023-12-28 DIAGNOSIS — D72829 Elevated white blood cell count, unspecified: Secondary | ICD-10-CM | POA: Diagnosis not present

## 2023-12-28 LAB — CBC WITH DIFFERENTIAL/PLATELET
Abs Immature Granulocytes: 0.09 10*3/uL — ABNORMAL HIGH (ref 0.00–0.07)
Basophils Absolute: 0 10*3/uL (ref 0.0–0.1)
Basophils Relative: 0 %
Eosinophils Absolute: 0.5 10*3/uL (ref 0.0–0.5)
Eosinophils Relative: 4 %
HCT: 34.1 % — ABNORMAL LOW (ref 36.0–46.0)
Hemoglobin: 11.1 g/dL — ABNORMAL LOW (ref 12.0–15.0)
Immature Granulocytes: 1 %
Lymphocytes Relative: 20 %
Lymphs Abs: 2.7 10*3/uL (ref 0.7–4.0)
MCH: 27.1 pg (ref 26.0–34.0)
MCHC: 32.6 g/dL (ref 30.0–36.0)
MCV: 83.4 fL (ref 80.0–100.0)
Monocytes Absolute: 1.1 10*3/uL — ABNORMAL HIGH (ref 0.1–1.0)
Monocytes Relative: 8 %
Neutro Abs: 8.8 10*3/uL — ABNORMAL HIGH (ref 1.7–7.7)
Neutrophils Relative %: 67 %
Platelets: 265 10*3/uL (ref 150–400)
RBC: 4.09 MIL/uL (ref 3.87–5.11)
RDW: 14.5 % (ref 11.5–15.5)
WBC: 13.2 10*3/uL — ABNORMAL HIGH (ref 4.0–10.5)
nRBC: 0 % (ref 0.0–0.2)

## 2023-12-28 LAB — BASIC METABOLIC PANEL
Anion gap: 5 (ref 5–15)
BUN: 8 mg/dL (ref 6–20)
CO2: 26 mmol/L (ref 22–32)
Calcium: 8.4 mg/dL — ABNORMAL LOW (ref 8.9–10.3)
Chloride: 108 mmol/L (ref 98–111)
Creatinine, Ser: 0.92 mg/dL (ref 0.44–1.00)
GFR, Estimated: >60 mL/min (ref >60)
Glucose, Bld: 101 mg/dL — ABNORMAL HIGH (ref 70–99)
Potassium: 4.1 mmol/L (ref 3.5–5.1)
Sodium: 139 mmol/L (ref 135–145)

## 2023-12-28 LAB — HCG, SERUM, QUALITATIVE: Preg, Serum: NEGATIVE

## 2023-12-28 MED ORDER — AMOXICILLIN 500 MG PO CAPS: 500.0000 mg | ORAL_CAPSULE | Freq: Three times a day (TID) | ORAL | 0 refills | Status: DC

## 2023-12-28 MED ORDER — IOHEXOL 350 MG/ML SOLN
75.0000 mL | Freq: Once | INTRAVENOUS | Status: AC | PRN
  Administered 2023-12-28: 75 mL via INTRAVENOUS

## 2023-12-28 MED ORDER — HYDROCODONE-ACETAMINOPHEN 5-325 MG PO TABS
1.0000 | ORAL_TABLET | Freq: Once | ORAL | Status: AC
  Administered 2023-12-28: 1 via ORAL
  Filled 2023-12-28: qty 1

## 2023-12-28 MED ORDER — DEXAMETHASONE SODIUM PHOSPHATE 10 MG/ML IJ SOLN
10.0000 mg | Freq: Once | INTRAMUSCULAR | Status: AC
  Administered 2023-12-28: 10 mg via INTRAVENOUS
  Filled 2023-12-28: qty 1

## 2023-12-28 NOTE — ED Notes (Signed)
 Patient transported to CT

## 2023-12-28 NOTE — Discharge Instructions (Signed)
 Tylenol for pain  Take the antibiotics  Follow-up outpatient, return for any worsening symptoms

## 2023-12-28 NOTE — ED Triage Notes (Signed)
 Pt is here for 10/10 dental pain in left lower jaw x 1 week. Pt states that it feels swollen.  No event preceded the pain.

## 2023-12-28 NOTE — ED Provider Notes (Signed)
 [place] EMERGENCY DEPARTMENT AT [facility] Provider Note   CSN: [id number] Arrival date & time: 12/28/23  1441    History  Chief Complaint  Patient presents with   Dental Pain    Phyllis Christian is a 23 y.o. female here for evaluation of left jaw pain.  Pain x 1 week.  Located left jaw.  Feels like the left side of her face is swollen.  No redness or warmth.  Is not take anything for pain.  She states she has some mild aching however is unable to tell if this is coming from her teeth.  Hurts to open jaw.  No recent injury or trauma.  No fever, nausea, vomiting, drainage.  No pain or swelling to her submandibular area.  Have cold symptoms 2 weeks ago, no sore throat.     HPI     Home Medications Prior to Admission medications   Medication Sig Start Date End Date Taking? Authorizing Provider  amoxicillin (AMOXIL) 500 MG capsule Take 1 capsule (500 mg total) by mouth 3 (three) times daily. 12/28/23  Yes Ezekial Arns A, PA-C  acetaminophen (TYLENOL) 500 MG tablet Take 500 mg by mouth every 8 (eight) hours as needed for mild pain.    [provider]  cetirizine (ZYRTEC ALLERGY) 10 MG tablet Take 1 tablet (10 mg total) by mouth daily. 12/25/23 12/24/24  Katheran James, DO  Norethindrone-Ethinyl Estradiol-Fe Biphas (LO LOESTRIN FE) 1 MG-10 MCG / 10 MCG tablet Take 1 tablet by mouth daily. 07/28/23   Masters, Katie, DO  SYMBICORT 160-4.5 MCG/ACT inhaler INHALE 2 PUFFS INTO THE LUNGS TWICE A DAY 10/30/23   Morene Crocker, MD      Allergies    Codeine and Motrin [ibuprofen]    Review of Systems   Review of Systems  Constitutional: Negative.   HENT:  Positive for dental problem and facial swelling. Negative for congestion, drooling, ear discharge, ear pain, postnasal drip, rhinorrhea, sinus pressure, sinus pain, sore throat, tinnitus, trouble swallowing and voice change.   Respiratory: Negative.    Cardiovascular: Negative.   Gastrointestinal: Negative.    Genitourinary: Negative.   Musculoskeletal: Negative.   Skin: Negative.   Neurological: Negative.   All other systems reviewed and are negative.   Physical Exam Updated Vital Signs BP 131/81   Pulse (!) 104   Temp 98.9 F (37.2 C)   Resp 18   SpO2 95%  Physical Exam Vitals and nursing note reviewed.  Constitutional:      General: She is not in acute distress.    Appearance: She is well-developed. She is not ill-appearing, toxic-appearing or diaphoretic.  HENT:     Head: Normocephalic and atraumatic.     Jaw: Tenderness and swelling present. No trismus, pain on movement or malocclusion.     Comments: No raccoon eye, Battle sign 3 finger opening.  Some soft tissue swelling to left jaw, no erythema, warmth.  No submandibular swelling, induration.    Mouth/Throat:     Pharynx: Oropharynx is clear. Uvula midline.     Comments: Mallampati 4, difficult to visualize PO due to anatomy.  Tenderness to left lower dentition diffusely.  No pooling of secretions. Eyes:     Pupils: Pupils are equal, round, and reactive to light.  Cardiovascular:     Rate and Rhythm: Normal rate.  Pulmonary:     Effort: No respiratory distress.  Abdominal:     General: There is no distension.  Musculoskeletal:  General: Normal range of motion.     Cervical back: Normal range of motion.  Skin:    General: Skin is warm and dry.  Neurological:     General: No focal deficit present.     Mental Status: She is alert.  Psychiatric:        Mood and Affect: Mood normal.    ED Results / Procedures / Treatments   Labs (all labs ordered are listed, but only abnormal results are displayed) Labs Reviewed  CBC WITH DIFFERENTIAL/PLATELET - Abnormal; Notable for the following components:      Result Value   WBC 13.2 (*)    Hemoglobin 11.1 (*)    HCT 34.1 (*)    Neutro Abs 8.8 (*)    Monocytes Absolute 1.1 (*)    Abs Immature Granulocytes 0.09 (*)    All other components within normal limits  BASIC  METABOLIC PANEL - Abnormal; Notable for the following components:   Glucose, Bld 101 (*)    Calcium 8.4 (*)    All other components within normal limits  HCG, SERUM, QUALITATIVE    EKG None  Radiology CT Maxillofacial W Contrast Result Date: 12/28/2023 CLINICAL DATA:  Left facial swelling. Dental pain in the left side of the mandible for 1 week. EXAM: CT MAXILLOFACIAL WITH CONTRAST TECHNIQUE: Multidetector CT imaging of the maxillofacial structures was performed with intravenous contrast. Multiplanar CT image reconstructions were also generated. RADIATION DOSE REDUCTION: This exam was performed according to the departmental dose-optimization program which includes automated exposure control, adjustment of the mA and/or kV according to patient size and/or use of iterative reconstruction technique. CONTRAST:  75mL OMNIPAQUE IOHEXOL 350 MG/ML SOLN COMPARISON:  None Available. FINDINGS: Osseous: Prominent dental caries is present in the second left maxillary molar. No significant periapical lucency is present. No associated soft tissue swelling is present. Mandible is intact and located. No acute focal osseous lesions are present. Orbits: The globes and orbits are within normal limits. Sinuses: The paranasal sinuses and mastoid air cells are clear. Soft tissues: No significant soft tissue abnormality is present over the face. Limited intracranial: Within normal limits. IMPRESSION: 1. Prominent dental caries in the second left maxillary molar. This could be a source of pain. 2. No significant periapical lucency or associated soft tissue swelling. 3. No other significant abnormality of the face. Electronically Signed   By: Marin Roberts M.D.   On: 12/28/2023 17:55    Procedures Procedures    Medications Ordered in ED Medications  dexamethasone (DECADRON) injection 10 mg (10 mg Intravenous Given 12/28/23 1619)  HYDROcodone-acetaminophen (NORCO/VICODIN) 5-325 MG per tablet 1 tablet (1 tablet Oral  Given 12/28/23 1617)  iohexol (OMNIPAQUE) 350 MG/ML injection 75 mL (75 mLs Intravenous Contrast Given 12/28/23 1727)    ED Course/ Medical Decision Making/ A&P   23 year old here for evaluation of left-sided facial pain.  poor historian.  States she cannot talk due to the pain.  She has no obvious facial swelling on exam.  No drooling, dysphagia or trismus.  No sore throat.  No systemic symptoms.  Plan on labs, imaging and reassess  Labs and imaging personally viewed and interpreted:  CT scan with dental caries, no abscess, cellulitis CBC leukocytosis 13.2 Metabolic panel without significant abnormality Preg negative  Discussed results with patient.  She is tolerating p.o. intake.  She cannot take NSAIDs, codeine.  I encouraged antibiotics, Tylenol, ice and follow-up with dentistry.  She is agreeable for outpatient follow-up.  The patient has been appropriately medically  screened and/or stabilized in the ED. I have low suspicion for any other emergent medical condition which would require further screening, evaluation or treatment in the ED or require inpatient management.  Patient is hemodynamically stable and in no acute distress.  Patient able to ambulate in department prior to ED.  Evaluation does not show acute pathology that would require ongoing or additional emergent interventions while in the emergency department or further inpatient treatment.  I have discussed the diagnosis with the patient and answered all questions.  Pain is been managed while in the emergency department and patient has no further complaints prior to discharge.  Patient is comfortable with plan discussed in room and is stable for discharge at this time.  I have discussed strict return precautions for returning to the emergency department.  Patient was encouraged to follow-up with PCP/specialist refer to at discharge.                                 Medical Decision Making Amount and/or Complexity of Data  Reviewed External Data Reviewed: labs, radiology and notes. Labs: ordered. Decision-making details documented in ED Course. Radiology: ordered and independent interpretation performed. Decision-making details documented in ED Course.  Risk OTC drugs. Prescription drug management. Decision regarding hospitalization. Diagnosis or treatment significantly limited by social determinants of health.          Final Clinical Impression(s) / ED Diagnoses Final diagnoses:  Pain, dental    Rx / DC Orders ED Discharge Orders          Ordered    amoxicillin (AMOXIL) 500 MG capsule  3 times daily        12/28/23 1810              Santonio Speakman A, PA-C 12/28/23 1813    Linwood Dibbles, MD 12/28/23 2234

## 2023-12-29 ENCOUNTER — Encounter: Payer: Self-pay | Admitting: Student

## 2024-01-03 NOTE — Progress Notes (Signed)
 Internal Medicine Clinic Attending  Case discussed with the resident at the time of the visit.  We reviewed the resident's history and exam and pertinent patient test results.  I agree with the assessment, diagnosis, and plan of care documented in the resident's note.

## 2024-04-23 ENCOUNTER — Encounter: Payer: Self-pay | Admitting: Advanced Practice Midwife

## 2024-07-14 ENCOUNTER — Emergency Department (HOSPITAL_COMMUNITY)
Admission: EM | Admit: 2024-07-14 | Discharge: 2024-07-14 | Discharge status: Against Medical Advice | Attending: Emergency Medicine | Admitting: Emergency Medicine

## 2024-07-14 ENCOUNTER — Emergency Department (HOSPITAL_BASED_OUTPATIENT_CLINIC_OR_DEPARTMENT_OTHER): Admitting: Radiology

## 2024-07-14 ENCOUNTER — Encounter (HOSPITAL_COMMUNITY): Payer: Self-pay | Admitting: *Deleted

## 2024-07-14 ENCOUNTER — Other Ambulatory Visit: Payer: Self-pay

## 2024-07-14 DIAGNOSIS — Z5321 Procedure and treatment not carried out due to patient leaving prior to being seen by health care provider: Secondary | ICD-10-CM | POA: Insufficient documentation

## 2024-07-14 DIAGNOSIS — J45909 Unspecified asthma, uncomplicated: Secondary | ICD-10-CM | POA: Insufficient documentation

## 2024-07-14 DIAGNOSIS — M549 Dorsalgia, unspecified: Secondary | ICD-10-CM | POA: Diagnosis present

## 2024-07-14 DIAGNOSIS — R0609 Other forms of dyspnea: Secondary | ICD-10-CM | POA: Insufficient documentation

## 2024-07-14 DIAGNOSIS — R519 Headache, unspecified: Secondary | ICD-10-CM | POA: Insufficient documentation

## 2024-07-14 DIAGNOSIS — F17290 Nicotine dependence, other tobacco product, uncomplicated: Secondary | ICD-10-CM | POA: Insufficient documentation

## 2024-07-14 DIAGNOSIS — M546 Pain in thoracic spine: Secondary | ICD-10-CM | POA: Insufficient documentation

## 2024-07-14 MED ORDER — IPRATROPIUM-ALBUTEROL 0.5-2.5 (3) MG/3ML IN SOLN
3.0000 mL | Freq: Once | RESPIRATORY_TRACT | Status: AC
  Administered 2024-07-14: 3 mL via RESPIRATORY_TRACT
  Filled 2024-07-14: qty 3

## 2024-07-14 NOTE — ED Notes (Signed)
 Patient said  I need to leave and proceeded to hand me her labels.
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
## 2024-07-15 ENCOUNTER — Emergency Department (HOSPITAL_BASED_OUTPATIENT_CLINIC_OR_DEPARTMENT_OTHER)

## 2024-07-15 ENCOUNTER — Emergency Department (HOSPITAL_BASED_OUTPATIENT_CLINIC_OR_DEPARTMENT_OTHER)
Admission: EM | Admit: 2024-07-15 | Discharge: 2024-07-15 | Disposition: A | Discharge status: Home / Self Care | Source: Home / Self Care | Attending: Emergency Medicine | Admitting: Emergency Medicine

## 2024-07-15 DIAGNOSIS — M546 Pain in thoracic spine: Secondary | ICD-10-CM

## 2024-07-15 DIAGNOSIS — J45909 Unspecified asthma, uncomplicated: Secondary | ICD-10-CM

## 2024-07-15 LAB — BASIC METABOLIC PANEL WITH GFR
Anion gap: 17 — ABNORMAL HIGH (ref 5–15)
BUN: 6 mg/dL (ref 6–20)
CO2: 20 mmol/L — ABNORMAL LOW (ref 22–32)
Calcium: 9.3 mg/dL (ref 8.9–10.3)
Chloride: 102 mmol/L (ref 98–111)
Creatinine, Ser: 0.9 mg/dL (ref 0.44–1.00)
GFR, Estimated: >60 mL/min (ref >60)
Glucose, Bld: 92 mg/dL (ref 70–99)
Potassium: 3.7 mmol/L (ref 3.5–5.1)
Sodium: 139 mmol/L (ref 135–145)

## 2024-07-15 LAB — CBC WITH DIFFERENTIAL/PLATELET
Abs Immature Granulocytes: 0.04 K/uL (ref 0.00–0.07)
Basophils Absolute: 0.1 K/uL (ref 0.0–0.1)
Basophils Relative: 0 %
Eosinophils Absolute: 0.6 K/uL — ABNORMAL HIGH (ref 0.0–0.5)
Eosinophils Relative: 4 %
HCT: 37.1 % (ref 36.0–46.0)
Hemoglobin: 12.7 g/dL (ref 12.0–15.0)
Immature Granulocytes: 0 %
Lymphocytes Relative: 26 %
Lymphs Abs: 3.5 K/uL (ref 0.7–4.0)
MCH: 26.9 pg (ref 26.0–34.0)
MCHC: 34.2 g/dL (ref 30.0–36.0)
MCV: 78.6 fL — ABNORMAL LOW (ref 80.0–100.0)
Monocytes Absolute: 0.8 K/uL (ref 0.1–1.0)
Monocytes Relative: 6 %
Neutro Abs: 8.6 K/uL — ABNORMAL HIGH (ref 1.7–7.7)
Neutrophils Relative %: 64 %
Platelets: 287 K/uL (ref 150–400)
RBC: 4.72 MIL/uL (ref 3.87–5.11)
RDW: 14.3 % (ref 11.5–15.5)
WBC: 13.6 K/uL — ABNORMAL HIGH (ref 4.0–10.5)
nRBC: 0 % (ref 0.0–0.2)

## 2024-07-15 LAB — HCG, QUANTITATIVE, PREGNANCY: hCG, Beta Chain, Quant, S: <1 m[IU]/mL (ref <5)

## 2024-07-15 LAB — TROPONIN T, HIGH SENSITIVITY: Troponin T High Sensitivity: <15 ng/L (ref 0–19)

## 2024-07-15 LAB — PRO BRAIN NATRIURETIC PEPTIDE: Pro Brain Natriuretic Peptide: <50 pg/mL (ref <300.0)

## 2024-07-15 MED ORDER — PREDNISONE 20 MG PO TABS: ORAL_TABLET | ORAL | 0 refills | Status: DC

## 2024-07-15 MED ORDER — IPRATROPIUM-ALBUTEROL 0.5-2.5 (3) MG/3ML IN SOLN
3.0000 mL | RESPIRATORY_TRACT | Status: DC
  Administered 2024-07-15: 3 mL via RESPIRATORY_TRACT
  Filled 2024-07-15: qty 3

## 2024-07-15 MED ORDER — PREDNISONE 50 MG PO TABS
60.0000 mg | ORAL_TABLET | Freq: Once | ORAL | Status: AC
  Administered 2024-07-15: 60 mg via ORAL
  Filled 2024-07-15: qty 1

## 2024-07-15 MED ORDER — IPRATROPIUM-ALBUTEROL 0.5-2.5 (3) MG/3ML IN SOLN
3.0000 mL | Freq: Once | RESPIRATORY_TRACT | Status: AC
  Administered 2024-07-15: 3 mL via RESPIRATORY_TRACT
  Filled 2024-07-15: qty 3

## 2024-07-15 MED ORDER — AZITHROMYCIN 250 MG PO TABS
500.0000 mg | ORAL_TABLET | Freq: Once | ORAL | Status: AC
  Administered 2024-07-15: 500 mg via ORAL
  Filled 2024-07-15: qty 2

## 2024-07-15 MED ORDER — AZITHROMYCIN 250 MG PO TABS: 250.0000 mg | ORAL_TABLET | Freq: Every day | ORAL | 0 refills | Status: DC

## 2024-07-15 MED ORDER — FENTANYL CITRATE PF 50 MCG/ML IJ SOSY
50.0000 ug | PREFILLED_SYRINGE | Freq: Once | INTRAMUSCULAR | Status: AC
  Administered 2024-07-15: 50 ug via INTRAVENOUS
  Filled 2024-07-15: qty 1

## 2024-07-15 MED ORDER — IOHEXOL 350 MG/ML SOLN
100.0000 mL | Freq: Once | INTRAVENOUS | Status: AC | PRN
  Administered 2024-07-15: 75 mL via INTRAVENOUS
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
## 2024-07-18 ENCOUNTER — Encounter (HOSPITAL_COMMUNITY): Payer: Self-pay

## 2024-07-18 ENCOUNTER — Other Ambulatory Visit: Payer: Self-pay

## 2024-07-18 ENCOUNTER — Emergency Department (HOSPITAL_COMMUNITY)
Admission: EM | Admit: 2024-07-18 | Discharge: 2024-07-18 | Disposition: A | Discharge status: Home / Self Care | Attending: Emergency Medicine | Admitting: Emergency Medicine

## 2024-07-18 DIAGNOSIS — J4541 Moderate persistent asthma with (acute) exacerbation: Secondary | ICD-10-CM | POA: Insufficient documentation

## 2024-07-18 DIAGNOSIS — Z7952 Long term (current) use of systemic steroids: Secondary | ICD-10-CM | POA: Diagnosis not present

## 2024-07-18 DIAGNOSIS — R062 Wheezing: Secondary | ICD-10-CM | POA: Diagnosis present

## 2024-07-18 MED ORDER — PREDNISONE 10 MG PO TABS: 40.0000 mg | ORAL_TABLET | Freq: Every day | ORAL | 0 refills | Status: AC

## 2024-07-18 MED ORDER — ALBUTEROL SULFATE HFA 108 (90 BASE) MCG/ACT IN AERS
2.0000 | INHALATION_SPRAY | Freq: Once | RESPIRATORY_TRACT | Status: AC
  Administered 2024-07-18: 2 via RESPIRATORY_TRACT
  Filled 2024-07-18: qty 6.7

## 2024-07-18 MED ORDER — PREDNISONE 20 MG PO TABS
60.0000 mg | ORAL_TABLET | Freq: Once | ORAL | Status: AC
  Administered 2024-07-18: 60 mg via ORAL
  Filled 2024-07-18: qty 3

## 2024-07-18 MED ORDER — ALBUTEROL SULFATE (2.5 MG/3ML) 0.083% IN NEBU
2.5000 mg | INHALATION_SOLUTION | Freq: Once | RESPIRATORY_TRACT | Status: AC
  Administered 2024-07-18: 2.5 mg via RESPIRATORY_TRACT
  Filled 2024-07-18: qty 3

## 2024-07-18 NOTE — ED Notes (Signed)
 Pt O2 remained between 95-100 % on room air while ambulating

## 2024-07-18 NOTE — Discharge Instructions (Signed)
 Asthma  Use inhaler every four hours for the next 4-5 days as needed.  Take prednisone  as prescribed.  Return to the ER for worsening wheezing, shortness of breath, or cough.  Follow up with your doctor in 3-5 days.  If you do not have a doctor, follow up with one of the numbers listed   STEROID ORAL  STEROID ORAL: You have been given a prescription for oral steroids.      This medication is used to reduce inflammation.     Prednisone  and other steroids can cause stomach irritation and ulcers in some individuals.  If you are prone to ulcers and gastritis, tell your physician before starting this medication.     Always take this medication as directed.     Keep this medication out of the reach of children.  Always keep this medication in child-proof containers.  DO NOT give your medication to anyone else. CAUTION:  DO NOT stop taking medication abruptly, especially if  you are given a tapering dose schedule.  Stopping this medication abruptly can cause serious complications!  THESE INSTRUCTIONS ARE NOT COMPREHENSIVE (complete):  Ask your pharmacist for additional information and precautions for this medication.   IMPORTANCE OF PRIMARY CARE DOCTOR (EDU)  IMPORTANCE OF PRIMARY CARE DOCTOR (EDU): You have been given instructions to follow up with a primary care physician.  A primary care physician is a doctor who helps with your health maintenance. For example, he or she provides yearly health exams to help determine your general well-being along with regular check-ups to help to identify potential health problems.  Your primary care physician serves as a main resource on all aspects of your health. In addition to treating existing medical conditions, this physician monitors your health over time. Your primary doctor can help you to recognize symptoms, or changes in your body that could be signs of new illness. Primary care physicians can look at the big picture, including your lifestyle  and family history. They can help plan the best ways of staying healthy and leading a long, productive life. They are also an important part in making referrals to specialists (such as doctors who specialize in specific disease conditions such as diabetes, heart disease, etc.).  There are many types of physicians who provide primary care. They all offer the benefits of a lasting, personal relationship based upon mutual trust and a thorough knowledge of an individual person. They also provide a wide range of healthcare services.      Family Medicine physicians provide comprehensive care for all family members, from newborns through older adults.     Internal Medicine physicians specialize in meeting the complete healthcare needs of adults, from teenagers through seniors, providing both primary and advanced levels of care.     Obstetrician/gynecologists often serve as primary physicians for women, performing routine physicals and health screenings in addition to obstetrical and gynecological care.     Pediatricians are experts in primary care for children, usually from infancy through the teen years. Primary care doctors may be either MDs or DOs. With today's modern medical training, the differences between an MD (Medical Doctor) and a D.O. (Doctor of Osteopathic Medicine) are minimal. Both MD's and DOs go to medical school and complete residencies in various medical specialties.  If you do not have a primary care physician, it takes a little homework and determination on your part. There are several options available in selecting the most appropriate doctor for your care. There are referrals lines in your  local area as well as specialists that work with your specific health care plan. Many people find a physician through word-of-mouth, asking their friends, neighbors or relatives. There are also referral lines in your local area. Hospital physician referral services are also another option. Your health  care plan may also offer referral services and most health plans offer the Ask A Nurse service. Referral services offer backgrounds of potential physicians, their educational and practice history, age range, office locations and hours, and the types of insurance coverage that they accept.  When you have decided which doctor may be right for you, make an appointment to ask questions about issues that are important to you. Frequently asked questions include the following:      Is the doctor on staff at a hospital? Which hospital?     What is the doctor's educational background?     Does the doctor specialize in certain areas of medicine?     How many years has his or her practice been established?     Is the doctor in practice by himself or herself, or in a group practice?     Is his or her office conveniently located?     What hours are available for appointments?     What types of insurance coverage does the doctor accept?     If you're on Medicare or Medicaid, does the doctor accept these plans?     How far in advance do you have to make an appointment? Are same-day appointments available?     How does the doctor handle situations when you need to see a doctor urgently?     What is the doctor's fee schedule? When is payment expected and how can it be made? When seeing a patient for the first time in a non-emergency situation, most doctors will begin a medical chart. This chart includes information about your health history. This record should include your present state of health, personal statistics (age, height, weight, occupation, whether you're a smoker or non-smoker), and your family history.  Establishing a GOOD RAPPORT (relationship) with your family doctor is EXTREMELY important! A PCP (Primary Care Physician) is the cornerstone of your care and should be the first person you call with any health concerns or problems. Being an established patient is VERY important so that you  can be seen quickly when an illness or injury does occur. Plan ahead and make an appointment with your chosen physician to become an established patient of his or her practice.   ASTHMA - WITH INHALER  ASTHMA: You have been seen for an asthma attack.  Asthma is also called reactive airway disease.  This disease occurs when the breathing tubes become irritated and go into a spasm. The spasm makes it difficult to breathe, especially when exhaling (breathing out). There is an inflammatory (swelling) component to asthma leading to an increase in the amount of mucus normally produced by the breathing tubes. Some medications treat the spasm (bronchodilators such as albuterol ), while others treat the inflammation (steroids such as prednisone  or prednisolone ).  Symptoms may include wheezing, difficulty breathing, and chest pain and tightness. Some patients do not wheeze, but may have a dry cough. Coughing until you vomit may also be a sign of asthma.  Things that trigger asthma attacks include medications (like aspirin ), tobacco smoke, air pollution, exercise, dust mites and pet dander.  Take an asthma attack seriously. Asthma can be life threatening!  Do not smoke. The medical evidence has strongly shown  that smoking causes heart disease, cancer, and birth defects. Avoiding cigarettes will help your asthma. Ask your regular doctor about methods to help you quit smoking.  If you do not smoke, avoid others that do. Smoke will irritate your lungs and make your asthma worse.  Avoid known irritants. Some of the most common ones are smoke, pollen, dust, mold, mildew, pets and perfumes. If you have an allergy to pollen, then stay indoors when the pollen counts are the highest.  Work closely with your doctor to control your asthma.  Use your albuterol  inhaler every four hours as needed for wheezing, coughing or shortness of breath. Using this medication as directed will help reduce symptoms. The proper  use of an albuterol  inhaler involves using a spacer device to assist the medication in reaching your lungs. Your doctor can prescribe you a spacer device.  Discuss with your doctor how to measure your peak expiratory flow rate with a handheld peak flow meter.  This will help your doctor to adjust your treatment.  Metered dose inhaler instructions:      Shake the inhaler.     Remove the caps on the inhaler and the spacer.     Place the inhaler in the spacer.     Breathe out.     Place the spacers mouthpiece in your mouth.     Press down on the inhaler to deliver a puff into the spacer.     Slowly and deeply inhale through your mouth.     Hold your breath for 5 seconds before exhaling.     Wait 1 minute, then repeat steps above for each puff.     Replace the caps on your inhaler and spacer.     Clean the spacer using the instructions that came with it.     If you are using a steroid inhaler, rinse your mouth after use with water . Do not let your inhaler become empty before seeing your doctor for a refill. To determine how full your inhaler is, remove the mouthpiece and place the inhaler in a bowl of water . If it floats near the surface of the water  with the nipple end pointing down, it is half full. If it floats flat on the surface, it is empty. Call your doctor immediately for a refill.  IT IS VERY IMPORTANT that you use the inhaler as instructed. Overuse can result in serious problems.  YOU SHOULD SEEK MEDICAL ATTENTION IMMEDIATELY, EITHER HERE OR AT THE NEAREST EMERGENCY DEPARTMENT, IF ANY OF THE FOLLOWING OCCURS:      No improvement in 48 to 72 hours or any worsening of your symptoms.     Vomiting or inability to keep medications down, dizziness, weakness, or confusion.     Increasing shortness of breath.     Inability to get relief when using your inhaler as prescribed.     Chest pain.     Coughing up green or yellow material.     Fever greater than 101 F (38.3  C).     Difficulty breathing or wheezing.  *RESOURCE GUIDE  RESOURCE GUIDE  Dental Problems  Patients with Medicaid: [facility] [phone] [address]  [address] Phone:  [phone]                                                                             Phone:  [phone]  If unable to pay or uninsured, contact:  [facility] or [facility]. to become qualified for the adult dental clinic.  Chronic Pain Problems Contact [facility]  [phone] Patients need to be referred by their primary care doctor.  Insufficient Money for Medicine Contact [employer]:  call 211 or [facility] [phone].  No Primary Care Doctor Call [facility]  [phone] Other agencies that provide inexpensive medical care    [facility]  [phone]    [facility] Internal Medicine  [phone]    [facility]  [phone]    [facility]  [phone]    [facility]  [phone]    [facility]  [phone]  Psychological Services [facility]  [phone] [facility]  [phone] [facility] Mental Health   [phone] (emergency services [phone])  Abuse/Neglect [facility] Child Abuse Hotline [phone] [facility] Child Abuse Hotline [phone] (After Hours)  Emergency Shelter [facility] [phone]  Maternity Homes [facility] [place] [facility] [phone] [facility] [phone]  MRSA Hotline #:   802-839-5453  [facility] Resources  [facility]     [employer]                          [facility] Dept. [address] [place]                        [address]          [address]  Tinnie Keenan Keenan Phone:  [phone]                                     Phone:  [phone]                   Phone:  [phone]  [facility] Phone:  [phone]  [facility] Child Abuse Hotline [phone] (After Hours)      If you develop symptoms of Shortness of Breath, Chest Pain, Swelling of lips, mouth or tongue or if your condition becomes worse with any new symptoms, see your doctor or return to the Emergency Department for immediate care. Emergency services are not intended to be a substitute for comprehensive medical attention.  Please contact your doctor for follow  up if not improving as expected.   Call your doctor in 5-7 days or as directed if there is no improvement.   Community Resources: *IF YOU ARE IN IMMEDIATE DANGER CALL 911!  Abuse/Neglect:  Family Services Crisis Hotline [facility]): 715-089-4677 [facility] Against Violence [facility]): 908-267-1271  After hours, holidays and weekends: ([phone] 651-5020 National Domestic Violence Hotline: 706-068-5042  Mental Health: [facility] Mental Health: GEANNIE [place]: [phone]  Health Clinics:  [facility]gent [facility] [place] [facility]): [phone] Monday - Friday 8 AM - 9 PM, Saturday and "Sunday 10 AM - 9 PM  [facility] [place]: [phone] Monday - Friday 8 AM - 5 PM  [facility]  [address]: [phone] Monday- Friday 8:30 AM - 5:30 PM, Sat 9 AM - 1 PM  24 HR [place] [facility] [address]: [phone] [facility] [address]: [phone] [facility] [address]: [phone]  24 HR [facility]: [address] [phone]  Cultures: If culture results are positive, we will notify you if a change in treatment is necessary.  LABORATORY TESTS:         If you had any labs drawn in the ED that have not resulted by the time you are discharged home, we will review  these lab results and the treatment given to you.  If there is any further treatment or notification needed, we will contact you by phone, or letter.  PLEASE ENSURE THAT YOU HAVE GIVEN US YOUR CURRENT WORKING PHONE NUMBER AND YOUR CURRENT ADDRESS, so that we can contact you if needed.  RADIOLOGY TESTS:  If the referred physician wants today's x-rays, please call the hospital's Radiology Department the day before your doctor's appointment. [place]     [phone] [place]   [phone] [place]     [phone]  Our doctors and staff appreciate your choosing us  for your emergency medical care needs. We are here to serve you.

## 2024-07-18 NOTE — ED Provider Notes (Signed)
 [place] EMERGENCY DEPARTMENT AT [facility] Provider Note   CSN: [id number] Arrival date & time: 07/18/24  0701     Patient presents with: Asthma   Phyllis Christian is a 23 y.o. female.    Asthma  Patient is a 23 year old female with a past medical history significant for asthma presenting to the emergency room today after running out of her albuterol  at home and developing progressively worsening wheezing she states that she feels relatively well right now after 1 DuoNeb that was given prior to my evaluation of her.  She denies any chest pain no hemoptysis no unilateral or bilateral leg swelling.  No history of DVT she is not a cancer patient she has no chest pain.     Prior to Admission medications   Medication Sig Start Date End Date Taking? Authorizing Provider  predniSONE  (DELTASONE ) 10 MG tablet Take 4 tablets (40 mg total) by mouth daily. 07/18/24  Yes Nakita Santerre S, PA  acetaminophen  (TYLENOL ) 500 MG tablet Take 500 mg by mouth every 8 (eight) hours as needed for mild pain.    [provider]  amoxicillin  (AMOXIL ) 500 MG capsule Take 1 capsule (500 mg total) by mouth 3 (three) times daily. 12/28/23   Henderly, Britni A, PA-C  azithromycin  (ZITHROMAX ) 250 MG tablet Take 1 tablet (250 mg total) by mouth daily. Take first 2 tablets together, then 1 every day until finished. 07/15/24   Mesner, Selinda, MD  cetirizine  (ZYRTEC  ALLERGY) 10 MG tablet Take 1 tablet (10 mg total) by mouth daily. 12/25/23 12/24/24  Harrie Bruckner, DO  Norethindrone -Ethinyl Estradiol-Fe Biphas (LO LOESTRIN FE) 1 MG-10 MCG / 10 MCG tablet Take 1 tablet by mouth daily. 07/28/23   Masters, Izetta, DO  SYMBICORT  160-4.5 MCG/ACT inhaler INHALE 2 PUFFS INTO THE LUNGS TWICE A DAY 10/30/23   Elnora Ip, MD    Allergies: Codeine and Motrin  [ibuprofen ]    Review of Systems  Updated Vital Signs BP 119/70   Pulse 69   Temp 98.1 F (36.7 C) (Oral)   Resp (!) 22   Ht 5' 6 (1.676  m)   Wt 108.5 kg   LMP 07/07/2024   SpO2 100%   BMI 38.61 kg/m   Physical Exam Vitals and nursing note reviewed.  Constitutional:      General: She is not in acute distress. HENT:     Head: Normocephalic and atraumatic.     Nose: Nose normal.  Eyes:     General: No scleral icterus. Cardiovascular:     Rate and Rhythm: Normal rate and regular rhythm.     Pulses: Normal pulses.     Heart sounds: Normal heart sounds.  Pulmonary:     Effort: Pulmonary effort is normal. No respiratory distress.     Breath sounds: No wheezing.     Comments: Speaking in full sentences, and expiratory wheezing present but not tachypneic well-appearing Abdominal:     Palpations: Abdomen is soft.     Tenderness: There is no abdominal tenderness. There is no guarding or rebound.  Musculoskeletal:     Cervical back: Normal range of motion.     Right lower leg: No edema.     Left lower leg: No edema.  Skin:    General: Skin is warm and dry.     Capillary Refill: Capillary refill takes less than 2 seconds.  Neurological:     Mental Status: She is alert. Mental status is at baseline.  Psychiatric:  Mood and Affect: Mood normal.        Behavior: Behavior normal.     (all labs ordered are listed, but only abnormal results are displayed) Labs Reviewed - No data to display  EKG: None  Radiology: No results found.   Procedures   Medications Ordered in the ED  albuterol  (PROVENTIL ) (2.5 MG/3ML) 0.083% nebulizer solution 2.5 mg (2.5 mg Nebulization Given 07/18/24 0713)  albuterol  (VENTOLIN  HFA) 108 (90 Base) MCG/ACT inhaler 2 puff (2 puffs Inhalation Given 07/18/24 0856)  predniSONE  (DELTASONE ) tablet 60 mg (60 mg Oral Given 07/18/24 0856)                                    Medical Decision Making Risk Prescription drug management.   Patient is a 23 year old female with a past medical history significant for asthma presenting to the emergency room today after running out of her  albuterol  at home and developing progressively worsening wheezing she states that she feels relatively well right now after 1 DuoNeb that was given prior to my evaluation of her.  She denies any chest pain no hemoptysis no unilateral or bilateral leg swelling.  No history of DVT she is not a cancer patient she has no chest pain.  Physical exam notable for some expiratory wheezing good air movement not tachypneic or ill-appearing  Patient ambulated for desaturation or tachypnea  Prednisone  given here will prescribe home prednisone  40 mg daily for 4 days and albuterol  inhaler given to patient.  Recommend close outpatient follow-up.  Return precautions emergency room provided.   Final diagnoses:  Moderate persistent asthma with exacerbation    ED Discharge Orders          Ordered    predniSONE  (DELTASONE ) 10 MG tablet  Daily        07/18/24 0850               Neldon Hamp RAMAN, PA 07/18/24 1259    Tegeler, Lonni PARAS, MD 07/18/24 1539

## 2024-07-18 NOTE — ED Triage Notes (Signed)
 Pt c.o asthma exacerbation since last night. States she is out of her inhaler.

## 2024-07-28 ENCOUNTER — Other Ambulatory Visit: Payer: Self-pay

## 2024-07-28 DIAGNOSIS — J4520 Mild intermittent asthma, uncomplicated: Secondary | ICD-10-CM

## 2024-07-28 MED ORDER — BUDESONIDE-FORMOTEROL FUMARATE 160-4.5 MCG/ACT IN AERO: 2.0000 | INHALATION_SPRAY | Freq: Every day | RESPIRATORY_TRACT | 3 refills | Status: AC

## 2024-11-10 ENCOUNTER — Encounter (HOSPITAL_BASED_OUTPATIENT_CLINIC_OR_DEPARTMENT_OTHER): Payer: Self-pay | Admitting: *Deleted

## 2024-11-10 ENCOUNTER — Emergency Department (HOSPITAL_BASED_OUTPATIENT_CLINIC_OR_DEPARTMENT_OTHER)
Admission: EM | Admit: 2024-11-10 | Discharge: 2024-11-10 | Disposition: A | Discharge status: Home / Self Care | Attending: Emergency Medicine | Admitting: Emergency Medicine

## 2024-11-10 ENCOUNTER — Other Ambulatory Visit: Payer: Self-pay

## 2024-11-10 DIAGNOSIS — J45901 Unspecified asthma with (acute) exacerbation: Secondary | ICD-10-CM | POA: Insufficient documentation

## 2024-11-10 MED ORDER — ALBUTEROL SULFATE (2.5 MG/3ML) 0.083% IN NEBU
5.0000 mg | INHALATION_SOLUTION | Freq: Once | RESPIRATORY_TRACT | Status: AC
  Administered 2024-11-10: 5 mg via RESPIRATORY_TRACT
  Filled 2024-11-10: qty 6

## 2024-11-10 MED ORDER — IPRATROPIUM-ALBUTEROL 0.5-2.5 (3) MG/3ML IN SOLN
3.0000 mL | Freq: Once | RESPIRATORY_TRACT | Status: AC
  Administered 2024-11-10: 3 mL via RESPIRATORY_TRACT
  Filled 2024-11-10: qty 3

## 2024-11-10 MED ORDER — ALBUTEROL SULFATE HFA 108 (90 BASE) MCG/ACT IN AERS
2.0000 | INHALATION_SPRAY | RESPIRATORY_TRACT | Status: DC | PRN
  Filled 2024-11-10: qty 6.7

## 2024-11-10 NOTE — ED Triage Notes (Signed)
 Pt with hx of asthma is here due to having wheezing at home.  Pt has been out of her albuterol  inhaler for 24 hours.

## 2024-11-10 NOTE — ED Provider Notes (Signed)
 " [place] EMERGENCY DEPARTMENT AT [facility] Provider Note   CSN: [id number] Arrival date & time: 11/10/24  1246     Patient presents with: Asthma   Phyllis Christian is a 24 y.o. female.   Patient presents to the emergency department today for evaluation of asthma attack.  Patient reports history of same.  She reports recently running out of albuterol  inhaler.  She was unable to get an appointment with her doctor to get a refill.  She states that she was walking outdoors today and her breathing became worse.  It is associated with a cough.  She did not have albuterol , so she came to the emergency department.  She has been doing well, keeping symptoms under control with inhaler, prior to today.  No fevers.  No other illnesses.  She does not feel strongly that she needs prednisone  at this time.       Prior to Admission medications  Medication Sig Start Date End Date Taking? Authorizing Provider  acetaminophen  (TYLENOL ) 500 MG tablet Take 500 mg by mouth every 8 (eight) hours as needed for mild pain.    [provider]  amoxicillin  (AMOXIL ) 500 MG capsule Take 1 capsule (500 mg total) by mouth 3 (three) times daily. 12/28/23   Henderly, Britni A, PA-C  azithromycin  (ZITHROMAX ) 250 MG tablet Take 1 tablet (250 mg total) by mouth daily. Take first 2 tablets together, then 1 every day until finished. 07/15/24   Mesner, Selinda, MD  budesonide -formoterol  (SYMBICORT ) 160-4.5 MCG/ACT inhaler Inhale 2 puffs into the lungs daily. 07/28/24   Elnora Ip, MD  cetirizine  (ZYRTEC  ALLERGY) 10 MG tablet Take 1 tablet (10 mg total) by mouth daily. 12/25/23 12/24/24  Harrie Bruckner, DO  Norethindrone -Ethinyl Estradiol-Fe Biphas (LO LOESTRIN FE) 1 MG-10 MCG / 10 MCG tablet Take 1 tablet by mouth daily. 07/28/23   Masters, Katie, DO  predniSONE  (DELTASONE ) 10 MG tablet Take 4 tablets (40 mg total) by mouth daily. 07/18/24   Neldon Hamp RAMAN, PA    Allergies: Codeine and Motrin   [ibuprofen ]    Review of Systems  Updated Vital Signs BP (!) 130/92 (BP Location: Right Arm)   Pulse 88   Temp 97.8 F (36.6 C) (Oral)   Resp 17   SpO2 100%   Physical Exam Vitals and nursing note reviewed.  Constitutional:      Appearance: She is well-developed.  HENT:     Head: Normocephalic and atraumatic.     Jaw: No trismus.     Right Ear: Tympanic membrane, ear canal and external ear normal.     Left Ear: Tympanic membrane, ear canal and external ear normal.     Nose: Nose normal. No mucosal edema or rhinorrhea.     Mouth/Throat:     Mouth: Mucous membranes are moist. Mucous membranes are not dry. No oral lesions.     Pharynx: Uvula midline. No oropharyngeal exudate, posterior oropharyngeal erythema or uvula swelling.     Tonsils: No tonsillar abscesses.  Eyes:     General:        Right eye: No discharge.        Left eye: No discharge.     Conjunctiva/sclera: Conjunctivae normal.  Cardiovascular:     Rate and Rhythm: Normal rate and regular rhythm.     Heart sounds: Normal heart sounds.  Pulmonary:     Effort: Pulmonary effort is normal. No respiratory distress.     Breath sounds: Wheezing present. No rales.  Abdominal:  Palpations: Abdomen is soft.     Tenderness: There is no abdominal tenderness.  Musculoskeletal:     Cervical back: Normal range of motion and neck supple.  Lymphadenopathy:     Cervical: No cervical adenopathy.  Skin:    General: Skin is warm and dry.  Neurological:     Mental Status: She is alert.  Psychiatric:        Mood and Affect: Mood normal.     (all labs ordered are listed, but only abnormal results are displayed) Labs Reviewed - No data to display  EKG: None  Radiology: No results found.   Procedures   Medications Ordered in the ED  ipratropium-albuterol  (DUONEB) 0.5-2.5 (3) MG/3ML nebulizer solution 3 mL (3 mLs Nebulization Given 11/10/24 1313)  albuterol  (PROVENTIL ) (2.5 MG/3ML) 0.083% nebulizer solution 5 mg (5 mg  Nebulization Given 11/10/24 1313)   ED Course  Patient seen and examined. History obtained directly from patient.   Labs/EKG: None ordered  Imaging: None ordered  Medications/Fluids: RT giving albuterol /Atrovent  in room.  Most recent vital signs reviewed and are as follows: BP (!) 130/92 (BP Location: Right Arm)   Pulse 88   Temp 97.8 F (36.6 C) (Oral)   Resp 17   SpO2 100%   Initial impression: Asthma attack.   2:14 PM Reassessment performed. Patient appears stable.  Patient reports improvement after breathing treatment.  On reexam, lungs are clear without wheezing.  Reviewed pertinent lab work and imaging with patient at bedside. Questions answered.   Most current vital signs reviewed and are as follows: BP (!) 130/92 (BP Location: Right Arm)   Pulse 88   Temp 97.8 F (36.6 C) (Oral)   Resp 17   SpO2 100%   Plan: Discharge to home.   Prescriptions written for: None  Other home care instructions discussed: Home with albuterol  inhaler.  ED return instructions discussed: Worsening shortness of breath or trouble breathing, other concerns.  Follow-up instructions discussed: Patient encouraged to follow-up with their PCP in 5 days.                                    Medical Decision Making Risk Prescription drug management.   Patient reports after an asthma attack.  She has been controlling these with an albuterol  inhaler, but ran out.  Patient with mild wheezing, no significant increased work of breathing on arrival.  This was treated with a DuoNeb with improvement.  Patient back at baseline.  Lungs are clear.  We agreed to defer prednisone  at this time.  No associated infectious symptoms suspected.  Do not suspect pneumonia.  Plan for discharge.     Final diagnoses:  Exacerbation of asthma, unspecified asthma severity, unspecified whether persistent    ED Discharge Orders     None          Desiderio Chew, PA-C 11/10/24 1416    Jerrol Agent,  MD 11/10/24 1421  "

## 2024-11-10 NOTE — Discharge Instructions (Signed)
 Please read and follow all provided instructions.  Your diagnoses today include:  1. Exacerbation of asthma, unspecified asthma severity, unspecified whether persistent    Tests performed today include: Vital signs. See below for your results today.   Medications prescribed:  Albuterol  inhaler - medication that opens up your airway  Use inhaler as follows: 1-2 puffs with spacer every 4 hours as needed for wheezing, cough, or shortness of breath.   Take any prescribed medications only as directed.  Home care instructions:  Follow any educational materials contained in this packet.  Follow-up instructions: Please follow-up with your primary care provider in the next 5 days for further evaluation of your symptoms and management of your asthma.  Return instructions:  Please return to the Emergency Department if you experience worsening symptoms. Please return with worsening wheezing, shortness of breath, or difficulty breathing. Return with persistent fever above 101F.  Please return if you have any other emergent concerns.  Additional Information:  Your vital signs today were: BP (!) 130/92 (BP Location: Right Arm)   Pulse 88   Temp 97.8 F (36.6 C) (Oral)   Resp 17   SpO2 100%  If your blood pressure (BP) was elevated above 135/85 this visit, please have this repeated by your doctor within one month. --------------

## 2024-11-10 NOTE — ED Notes (Signed)
 Reviewed AVS/discharge instruction with patient. Time allotted for and all questions answered. Patient is agreeable for d/c and escorted to ed exit by staff.
